# Patient Record
Sex: Female | Born: 1960
Health system: Southern US, Community
[De-identification: ages and names within clinical notes are randomized; demographics above are authoritative.]

## PROBLEM LIST (undated history)

## (undated) DIAGNOSIS — R7301 Impaired fasting glucose: Secondary | ICD-10-CM

## (undated) DIAGNOSIS — L03115 Cellulitis of right lower limb: Secondary | ICD-10-CM

## (undated) DIAGNOSIS — T7840XA Allergy, unspecified, initial encounter: Secondary | ICD-10-CM

## (undated) DIAGNOSIS — I10 Essential (primary) hypertension: Secondary | ICD-10-CM

## (undated) DIAGNOSIS — R740 Nonspecific elevation of levels of transaminase and lactic acid dehydrogenase [LDH]: Secondary | ICD-10-CM

## (undated) DIAGNOSIS — D649 Anemia, unspecified: Secondary | ICD-10-CM

## (undated) HISTORY — DX: Cellulitis of right lower limb: L03.115

## (undated) HISTORY — DX: Impaired fasting glucose: R73.01

## (undated) HISTORY — DX: Allergy, unspecified, initial encounter: T78.40XA

## (undated) HISTORY — DX: Nonspecific elevation of levels of transaminase and lactic acid dehydrogenase (ldh): R74.0

## (undated) HISTORY — PX: DILATION AND CURETTAGE OF UTERUS: SHX78

## (undated) HISTORY — PX: OTHER SURGICAL HISTORY: SHX169

## (undated) HISTORY — PX: COLONOSCOPY: SHX174

## (undated) HISTORY — DX: Essential (primary) hypertension: I10

## (undated) HISTORY — PX: TUBAL LIGATION: SHX77

## (undated) HISTORY — DX: Anemia, unspecified: D64.9

---

## 1998-07-27 ENCOUNTER — Other Ambulatory Visit: Admission: RE | Admit: 1998-07-27 | Discharge: 1998-07-27 | Payer: Self-pay | Admitting: Obstetrics and Gynecology

## 1999-07-09 ENCOUNTER — Ambulatory Visit (HOSPITAL_COMMUNITY): Admission: RE | Admit: 1999-07-09 | Discharge: 1999-07-09 | Payer: Self-pay | Admitting: *Deleted

## 1999-07-10 ENCOUNTER — Encounter: Payer: Self-pay | Admitting: Family Medicine

## 1999-07-10 ENCOUNTER — Inpatient Hospital Stay (HOSPITAL_COMMUNITY): Admission: AD | Admit: 1999-07-10 | Discharge: 1999-07-15 | Payer: Self-pay | Admitting: Family Medicine

## 2002-09-11 ENCOUNTER — Other Ambulatory Visit: Admission: RE | Admit: 2002-09-11 | Discharge: 2002-09-11 | Payer: Self-pay | Admitting: Obstetrics and Gynecology

## 2005-01-06 ENCOUNTER — Ambulatory Visit: Payer: Self-pay | Admitting: Internal Medicine

## 2006-02-06 ENCOUNTER — Ambulatory Visit: Payer: Self-pay | Admitting: Internal Medicine

## 2006-02-06 LAB — CONVERTED CEMR LAB
AST: 28 units/L (ref 0–37)
Cholesterol: 125 mg/dL (ref 0–200)
Hgb A1c MFr Bld: 5.9 % (ref 4.6–6.0)
LDL Cholesterol: 70 mg/dL (ref 0–99)
Microalb Creat Ratio: 5.6 mg/g (ref 0.0–30.0)
Microalb, Ur: 0.7 mg/dL (ref 0.0–1.9)
TSH: 1.46 microintl units/mL (ref 0.35–5.50)

## 2006-02-13 ENCOUNTER — Ambulatory Visit: Payer: Self-pay | Admitting: Internal Medicine

## 2006-05-18 ENCOUNTER — Encounter: Admission: RE | Admit: 2006-05-18 | Discharge: 2006-05-18 | Payer: Self-pay | Admitting: Obstetrics and Gynecology

## 2006-07-21 ENCOUNTER — Other Ambulatory Visit: Admission: RE | Admit: 2006-07-21 | Discharge: 2006-07-21 | Payer: Self-pay | Admitting: Diagnostic Radiology

## 2006-07-21 ENCOUNTER — Encounter: Admission: RE | Admit: 2006-07-21 | Discharge: 2006-07-21 | Payer: Self-pay | Admitting: *Deleted

## 2006-07-21 ENCOUNTER — Encounter (INDEPENDENT_AMBULATORY_CARE_PROVIDER_SITE_OTHER): Payer: Self-pay | Admitting: Diagnostic Radiology

## 2006-08-02 ENCOUNTER — Encounter: Payer: Self-pay | Admitting: Internal Medicine

## 2006-08-29 ENCOUNTER — Encounter: Admission: RE | Admit: 2006-08-29 | Discharge: 2006-08-29 | Payer: Self-pay | Admitting: *Deleted

## 2007-02-23 ENCOUNTER — Ambulatory Visit: Payer: Self-pay | Admitting: Internal Medicine

## 2007-07-03 ENCOUNTER — Ambulatory Visit: Payer: Self-pay | Admitting: Internal Medicine

## 2007-07-03 ENCOUNTER — Telehealth (INDEPENDENT_AMBULATORY_CARE_PROVIDER_SITE_OTHER): Payer: Self-pay | Admitting: *Deleted

## 2007-07-03 DIAGNOSIS — Z9889 Other specified postprocedural states: Secondary | ICD-10-CM | POA: Insufficient documentation

## 2007-07-03 DIAGNOSIS — D126 Benign neoplasm of colon, unspecified: Secondary | ICD-10-CM | POA: Insufficient documentation

## 2007-07-03 DIAGNOSIS — I152 Hypertension secondary to endocrine disorders: Secondary | ICD-10-CM | POA: Insufficient documentation

## 2007-07-03 DIAGNOSIS — D649 Anemia, unspecified: Secondary | ICD-10-CM | POA: Insufficient documentation

## 2007-07-03 DIAGNOSIS — I10 Essential (primary) hypertension: Secondary | ICD-10-CM | POA: Insufficient documentation

## 2007-07-03 DIAGNOSIS — H18609 Keratoconus, unspecified, unspecified eye: Secondary | ICD-10-CM | POA: Insufficient documentation

## 2007-07-03 LAB — CONVERTED CEMR LAB
Ketones, urine, test strip: NEGATIVE
Nitrite: NEGATIVE

## 2007-07-05 ENCOUNTER — Encounter (INDEPENDENT_AMBULATORY_CARE_PROVIDER_SITE_OTHER): Payer: Self-pay | Admitting: *Deleted

## 2007-07-05 LAB — CONVERTED CEMR LAB
ALT: 26 units/L (ref 0–35)
AST: 23 units/L (ref 0–37)
Basophils Absolute: 0 10*3/uL (ref 0.0–0.1)
Bilirubin, Direct: 0.1 mg/dL (ref 0.0–0.3)
CO2: 28 meq/L (ref 19–32)
Chloride: 99 meq/L (ref 96–112)
Cholesterol: 135 mg/dL (ref 0–200)
LDL Cholesterol: 83 mg/dL (ref 0–99)
Lymphocytes Relative: 28 % (ref 12.0–46.0)
MCHC: 34.4 g/dL (ref 30.0–36.0)
Neutrophils Relative %: 60.4 % (ref 43.0–77.0)
RBC: 4.16 M/uL (ref 3.87–5.11)
RDW: 14.5 % (ref 11.5–14.6)
Sodium: 138 meq/L (ref 135–145)
Total Bilirubin: 0.8 mg/dL (ref 0.3–1.2)
Triglycerides: 73 mg/dL (ref 0–149)
VLDL: 15 mg/dL (ref 0–40)

## 2007-07-11 ENCOUNTER — Encounter: Admission: RE | Admit: 2007-07-11 | Discharge: 2007-07-11 | Payer: Self-pay | Admitting: Internal Medicine

## 2007-07-12 ENCOUNTER — Encounter: Payer: Self-pay | Admitting: Internal Medicine

## 2007-07-18 ENCOUNTER — Encounter: Admission: RE | Admit: 2007-07-18 | Discharge: 2007-07-18 | Payer: Self-pay | Admitting: Internal Medicine

## 2008-03-24 ENCOUNTER — Ambulatory Visit: Payer: Self-pay | Admitting: Internal Medicine

## 2008-04-02 ENCOUNTER — Telehealth (INDEPENDENT_AMBULATORY_CARE_PROVIDER_SITE_OTHER): Payer: Self-pay | Admitting: *Deleted

## 2008-05-20 ENCOUNTER — Telehealth (INDEPENDENT_AMBULATORY_CARE_PROVIDER_SITE_OTHER): Payer: Self-pay | Admitting: *Deleted

## 2008-07-04 ENCOUNTER — Emergency Department (HOSPITAL_COMMUNITY): Admission: EM | Admit: 2008-07-04 | Discharge: 2008-07-04 | Payer: Self-pay | Admitting: Family Medicine

## 2008-07-07 ENCOUNTER — Telehealth (INDEPENDENT_AMBULATORY_CARE_PROVIDER_SITE_OTHER): Payer: Self-pay | Admitting: *Deleted

## 2008-07-08 ENCOUNTER — Encounter: Payer: Self-pay | Admitting: Internal Medicine

## 2009-01-31 DIAGNOSIS — R7401 Elevation of levels of liver transaminase levels: Secondary | ICD-10-CM

## 2009-01-31 DIAGNOSIS — R7301 Impaired fasting glucose: Secondary | ICD-10-CM

## 2009-01-31 HISTORY — DX: Impaired fasting glucose: R73.01

## 2009-01-31 HISTORY — DX: Elevation of levels of liver transaminase levels: R74.01

## 2009-03-30 ENCOUNTER — Telehealth (INDEPENDENT_AMBULATORY_CARE_PROVIDER_SITE_OTHER): Payer: Self-pay | Admitting: *Deleted

## 2009-04-15 ENCOUNTER — Telehealth (INDEPENDENT_AMBULATORY_CARE_PROVIDER_SITE_OTHER): Payer: Self-pay | Admitting: *Deleted

## 2009-04-26 ENCOUNTER — Emergency Department (HOSPITAL_COMMUNITY): Admission: EM | Admit: 2009-04-26 | Discharge: 2009-04-26 | Payer: Self-pay | Admitting: Family Medicine

## 2009-07-08 ENCOUNTER — Ambulatory Visit: Payer: Self-pay | Admitting: Internal Medicine

## 2009-07-08 DIAGNOSIS — J3089 Other allergic rhinitis: Secondary | ICD-10-CM

## 2009-07-08 DIAGNOSIS — D485 Neoplasm of uncertain behavior of skin: Secondary | ICD-10-CM | POA: Insufficient documentation

## 2009-07-08 DIAGNOSIS — J302 Other seasonal allergic rhinitis: Secondary | ICD-10-CM | POA: Insufficient documentation

## 2009-07-13 LAB — CONVERTED CEMR LAB
ALT: 43 units/L — ABNORMAL HIGH (ref 0–35)
AST: 31 units/L (ref 0–37)
Alkaline Phosphatase: 60 units/L (ref 39–117)
BUN: 16 mg/dL (ref 6–23)
Bilirubin, Direct: 0.1 mg/dL (ref 0.0–0.3)
Calcium: 10.5 mg/dL (ref 8.4–10.5)
Cholesterol: 140 mg/dL (ref 0–200)
Eosinophils Relative: 1.9 % (ref 0.0–5.0)
GFR calc non Af Amer: 117.25 mL/min (ref >60)
HCT: 39.6 % (ref 36.0–46.0)
LDL Cholesterol: 90 mg/dL (ref 0–99)
Lymphocytes Relative: 24.5 % (ref 12.0–46.0)
Lymphs Abs: 1.8 10*3/uL (ref 0.7–4.0)
Monocytes Relative: 6.3 % (ref 3.0–12.0)
Platelets: 297 10*3/uL (ref 150.0–400.0)
Potassium: 4.1 meq/L (ref 3.5–5.1)
Sodium: 140 meq/L (ref 135–145)
TSH: 1.4 microintl units/mL (ref 0.35–5.50)
Total Bilirubin: 0.7 mg/dL (ref 0.3–1.2)
VLDL: 10.2 mg/dL (ref 0.0–40.0)
WBC: 7.4 10*3/uL (ref 4.5–10.5)

## 2009-07-14 ENCOUNTER — Encounter (INDEPENDENT_AMBULATORY_CARE_PROVIDER_SITE_OTHER): Payer: Self-pay | Admitting: *Deleted

## 2009-07-29 ENCOUNTER — Telehealth (INDEPENDENT_AMBULATORY_CARE_PROVIDER_SITE_OTHER): Payer: Self-pay | Admitting: *Deleted

## 2009-08-07 ENCOUNTER — Encounter (INDEPENDENT_AMBULATORY_CARE_PROVIDER_SITE_OTHER): Payer: Self-pay | Admitting: *Deleted

## 2009-08-11 ENCOUNTER — Ambulatory Visit: Payer: Self-pay | Admitting: Gastroenterology

## 2009-08-27 ENCOUNTER — Ambulatory Visit: Payer: Self-pay | Admitting: Gastroenterology

## 2009-08-31 ENCOUNTER — Encounter: Payer: Self-pay | Admitting: Gastroenterology

## 2009-10-22 ENCOUNTER — Telehealth (INDEPENDENT_AMBULATORY_CARE_PROVIDER_SITE_OTHER): Payer: Self-pay | Admitting: *Deleted

## 2009-10-27 ENCOUNTER — Ambulatory Visit: Payer: Self-pay | Admitting: Internal Medicine

## 2009-10-27 DIAGNOSIS — J019 Acute sinusitis, unspecified: Secondary | ICD-10-CM | POA: Insufficient documentation

## 2010-02-15 ENCOUNTER — Telehealth (INDEPENDENT_AMBULATORY_CARE_PROVIDER_SITE_OTHER): Payer: Self-pay | Admitting: *Deleted

## 2010-03-04 NOTE — Progress Notes (Signed)
Summary: rx for mail order   Phone Note Refill Request Call back at Work Phone [phone]   Refills Requested: Medication #1:  AMLODIPINE BESYLATE 5 MG TABS 1 once daily  Medication #2:  LOSARTAN POTASSIUM-HCTZ 100-12.5 MG TABS patient took rx to [facility] but they only fill it for 30 day supply - they told her it wasnt on the $4.00 list - she wants rx written for 90 day supply & she will send to mail order    Initial call taken by: Okey Regal Spring,  July 29, 2009 10:33 AM  Follow-up for Phone Call        Patient aware rx at the front for pick-up Follow-up by: Shonna Chock,  July 29, 2009 11:46 AM    New/Updated Medications: LOSARTAN POTASSIUM-HCTZ 100-12.5 MG TABS (LOSARTAN POTASSIUM-HCTZ) 1 by mouth once daily Prescriptions: LOSARTAN POTASSIUM-HCTZ 100-12.5 MG TABS (LOSARTAN POTASSIUM-HCTZ) 1 by mouth once daily  #90 x 3   Entered by:   Shonna Chock   Authorized by:   Neena Rhymes MD   Signed by:   Shonna Chock on 07/29/2009   Method used:   Print then Give to Patient   RxID:   [id number] AMLODIPINE BESYLATE 5 MG TABS (AMLODIPINE BESYLATE) 1 once daily  #90 x 3   Entered by:   Shonna Chock   Authorized by:   Neena Rhymes MD   Signed by:   Shonna Chock on 07/29/2009   Method used:   Print then Give to Patient   RxID:   [id number]

## 2010-03-04 NOTE — Progress Notes (Signed)
Summary: PHONE-REFILL  Phone Note Call from Patient Call back at [facility] Phone [phone]   Caller: Patient Summary of Call: PATIENT IS REQUESTING THE ONE THAT IS COMBINE MICARDIS AND HCT COMBINE. [facility] [address]. PATIENT STATES SHE OUT OF MEDS. Initial call taken by: Barb Merino,  March 30, 2009 2:52 PM  Follow-up for Phone Call        Losartan /HCT 100/12.5 should be less expensive. #91,YN8. She is due for OV F/U Follow-up by: Marga Melnick MD,  March 31, 2009 1:22 PM  Additional Follow-up for Phone Call Additional follow up Details #1::        left detailed msg on patient voicemail also to scheduled office visit to make sure med is working.....Marland KitchenMarland KitchenDoristine Devoid  March 31, 2009 1:38 PM     New/Updated Medications: LOSARTAN POTASSIUM-HCTZ 100-12.5 MG TABS (LOSARTAN POTASSIUM-HCTZ) take one tablet daily Prescriptions: LOSARTAN POTASSIUM-HCTZ 100-12.5 MG TABS (LOSARTAN POTASSIUM-HCTZ) take one tablet daily  #30 x 2   Entered by:   Doristine Devoid   Authorized by:   Marga Melnick MD   Signed by:   Doristine Devoid on 03/31/2009   Method used:   [facility]        [facility] [address] [address] [phone]* (retail)       [address]       [place], [place]  [address]       Ph: [phone] or [phone]       Fax: [phone]   RxID:   [id number]

## 2010-03-04 NOTE — Assessment & Plan Note (Signed)
Summary: SINUS INFECTION/CDJ   Vital Signs:  Patient profile:   50 year old female Height:      66.25 inches Weight:      248 pounds O2 Sat:      99 % on Room air Temp:     98.3 degrees F oral Pulse rate:   98 / minute Resp:     16 per minute BP sitting:   140 / 82  (left arm)  Vitals Entered By: Jeremy Johann CMA (October 27, 2009 3:13 PM)  O2 Flow:  Room air CC: cough yellowish mucous, chest congestion, horseness x1week, URI symptoms   CC:  cough yellowish mucous, chest congestion, horseness x1week, and URI symptoms.  History of Present Illness: RTI  Symptoms      This is a 50 year old woman who presents with RTI  symptoms X 1 week.  The patient reports nasal congestion, purulent nasal discharge, and dry cough ( but" rattly"), but denies sore throat and earache.  The patient denies fever.  The patient also reports headache.  Risk factors for Strep sinusitis include bilateral facial pain.  The patient denies the following risk factors for Strep sinusitis: tooth pain and tender adenopathy.  Rx: Loratidine , Netipot , Mucinex   Current Medications (verified): 1)  Amlodipine Besylate 5 Mg Tabs (Amlodipine Besylate) .Marland Kitchen.. 1 Once Daily 2)  Losartan Potassium-Hctz 100-12.5 Mg Tabs (Losartan Potassium-Hctz) .Marland Kitchen.. 1 By Mouth Once Daily 3)  Loratadine 10 Mg Tabs (Loratadine) .Marland Kitchen.. 1 Once Daily As Needed Fro Allergies  Allergies (verified): No Known Drug Allergies  Physical Exam  General:  well-nourished,in no acute distress; alert,appropriate and cooperative throughout examination Ears:  External ear exam shows no significant lesions or deformities.  Otoscopic examination reveals clear canals, tympanic membranes are intact bilaterally without bulging, retraction, inflammation or discharge. Hearing is grossly normal bilaterally. Nose:  External nasal examination shows no deformity or inflammation. Nasal mucosa are pink and moist without lesions or exudates. Mouth:  Oral mucosa and  oropharynx without lesions or exudates.  Teeth in good repair. No pharyngeal erythema.   Lungs:  Normal respiratory effort, chest expands symmetrically. Lungs are clear to auscultation, no crackles or wheezes but cough. Heart:  normal rate, regular rhythm, no gallop, no rub, no JVD, and grade 1 /6 systolic murmur.   Cervical Nodes:  No lymphadenopathy noted Axillary Nodes:  No palpable lymphadenopathy   Impression & Recommendations:  Problem # 1:  BRONCHITIS-ACUTE (ICD-466.0)  Her updated medication list for this problem includes:    Amoxicillin 500 Mg Caps (Amoxicillin) .Marland Kitchen... 1 three times a day    Hydromet 5-1.5 Mg/76ml Syrp (Hydrocodone-homatropine) .Marland Kitchen... 1 tsp every 6 hrs as needed  Problem # 2:  SINUSITIS- ACUTE-NOS (ICD-461.9)  Her updated medication list for this problem includes:    Amoxicillin 500 Mg Caps (Amoxicillin) .Marland Kitchen... 1 three times a day    Hydromet 5-1.5 Mg/62ml Syrp (Hydrocodone-homatropine) .Marland Kitchen... 1 tsp every 6 hrs as needed    Fluticasone Propionate 50 Mcg/act Susp (Fluticasone propionate) .Marland Kitchen... 1 spray two times a day  Complete Medication List: 1)  Amlodipine Besylate 5 Mg Tabs (Amlodipine besylate) .Marland Kitchen.. 1 once daily 2)  Losartan Potassium-hctz 100-12.5 Mg Tabs (Losartan potassium-hctz) .Marland Kitchen.. 1 by mouth once daily 3)  Loratadine 10 Mg Tabs (Loratadine) .Marland Kitchen.. 1 once daily as needed fro allergies 4)  Amoxicillin 500 Mg Caps (Amoxicillin) .Marland Kitchen.. 1 three times a day 5)  Hydromet 5-1.5 Mg/61ml Syrp (Hydrocodone-homatropine) .Marland Kitchen.. 1 tsp every 6 hrs as needed 6)  Fluticasone Propionate 50 Mcg/act Susp (Fluticasone propionate) .Marland Kitchen.. 1 spray two times a day  Patient Instructions: 1)  Drink as much NON dairy  fluid as you can tolerate for the next few days. Continue Neti pot once daily until sinuses are clear. Prescriptions: FLUTICASONE PROPIONATE 50 MCG/ACT SUSP (FLUTICASONE PROPIONATE) 1 spray two times a day  #1 x 5   Entered and Authorized by:   Marga Melnick MD   Signed by:    Marga Melnick MD on 10/27/2009   Method used:   [facility]ed to ...       [facility] [address] [phone]* (retail)       [address]       [place], [place]  [address]       Ph: [phone] or [phone]       Fax: [phone]   RxID:   [id number] HYDROMET 5-1.5 MG/5ML SYRP (HYDROCODONE-HOMATROPINE) 1 tsp every 6 hrs as needed  #120cc x 0   Entered and Authorized by:   Marga Melnick MD   Signed by:   Marga Melnick MD on 10/27/2009   Method used:   Printed then faxed to ...       [facility] [address] [phone]* (retail)       [address]       [place], [place]  [address]       Ph: [phone] or [phone]       Fax: [phone]   RxID:   [id number] AMOXICILLIN 500 MG CAPS (AMOXICILLIN) 1 three times a day  #30 x 0   Entered and Authorized by:   Marga Melnick MD   Signed by:   Marga Melnick MD on 10/27/2009   Method used:   Faxed to ...       [facility] [address] [phone]* (retail)       [address]       [place], [place]  [address]       Ph: [phone] or [phone]       Fax: [phone]   RxID:   [id number]

## 2010-03-04 NOTE — Letter (Signed)
Summary: Patient Notice-Hyperplastic Polyps  [facility]  [address] [place], [place] [address]   Phone: [phone]  Fax: [phone]        August 31, 2009 MRN: [id number]    Rebecca Foley [address] [place], [place]  [address]    Dear [name],  I am pleased to inform you that the colon polyp(s) removed during your recent colonoscopy was (were) found to be hyperplastic.  These types of polyps are NOT pre-cancerous.  It is therefore my recommendation that you have a repeat colonoscopy examination in 10_ years for routine colorectal cancer screening.  Should you develop new or worsening symptoms of abdominal pain, bowel habit changes or bleeding from the rectum or bowels, please schedule an evaluation with either your primary care physician or with me.  Additional information/recommendations:  __No further action with gastroenterology is needed at this time.      Please follow-up with your primary care physician for your other      healthcare needs. __Please call [phone] to schedule a return visit to review      your situation.  __Please keep your follow-up visit as already scheduled.  _x_Continue treatment plan as outlined the day of your exam.  Please call us if you are having persistent problems or have questions about your condition that have not been fully answered at this time.  Sincerely,  Louis Meckel MD This letter has been electronically signed by your physician.  Appended Document: Patient Notice-Hyperplastic Polyps Letter mailed 8.3.2011

## 2010-03-04 NOTE — Progress Notes (Signed)
Summary: med for laryngiti  Phone Note Call from Patient   Caller: Patient Summary of Call: patient msg on voicemail wanted to get medication for laryngitis  Follow-up for Phone Call        left message on machine ........Marland KitchenDoristine Devoid CMA  October 22, 2009 1:38 PM   patient return tried calling back left message on machine detailed information that atb not treat for laryngitis informed to drink plenty of fluids and use ibruprofen to help decrease inflammation.......Marland KitchenDoristine Devoid CMA  October 22, 2009 2:57 PM

## 2010-03-04 NOTE — Miscellaneous (Signed)
Summary: [facility] Previsit/prep  Clinical Lists Changes  Medications: Added new medication of MOVIPREP 100 GM  SOLR (PEG-KCL-NACL-NASULF-NA ASC-C) As per prep instructions. - Signed Rx of MOVIPREP 100 GM  SOLR (PEG-KCL-NACL-NASULF-NA ASC-C) As per prep instructions.;  #1 x 0;  Signed;  Entered by: Wyona Almas RN;  Authorized by: Louis Meckel MD;  Method used: [facility] [address] [phone]*, [address] [place], [place], [place]  [address], Ph: [phone] or [phone], Fax: [phone] Observations: Added new observation of NKA: T (08/11/2009 8:06)    Prescriptions: MOVIPREP 100 GM  SOLR (PEG-KCL-NACL-NASULF-NA ASC-C) As per prep instructions.  #1 x 0   Entered by:   Wyona Almas RN   Authorized by:   Louis Meckel MD   Signed by:   Wyona Almas RN on 08/11/2009   Method used:   Electronically to        [facility] [address] [phone]* (retail)       [address]       [place], [place]  [address]       Ph: [phone] or [phone]       Fax: [phone]   RxID:   [id number]

## 2010-03-04 NOTE — Progress Notes (Signed)
Summary: Amlodipine refill  Phone Note Refill Request Message from:  Fax from Pharmacy on February 15, 2010 9:13 AM  Refills Requested: Medication #1:  AMLODIPINE BESYLATE 5 MG TABS 1 once daily [place], phone - [phone],  fax   [phone]    qrty = 90   (Ref# [id number] if calling)    ***NOTE***  fax has Dr Alwyn Ren listed as [facility] Doctor  Next Appointment Scheduled: none Initial call taken by: Jerolyn Shin,  February 15, 2010 9:16 AM  Follow-up for Phone Call        RX faxed to: [phone]   Follow-up by: Shonna Chock CMA,  February 15, 2010 1:40 PM    Prescriptions: AMLODIPINE BESYLATE 5 MG TABS (AMLODIPINE BESYLATE) 1 once daily  #90 x 2   Entered by:   Shonna Chock CMA   Authorized by:   Marga Melnick MD   Signed by:   Shonna Chock CMA on 02/15/2010   Method used:   Print then Give to Patient   RxID:   [id number]

## 2010-03-04 NOTE — Procedures (Signed)
Summary: Colonoscopy  Patient: [name] Note: All result statuses are Final unless otherwise noted.  Tests: (1) Colonoscopy (COL)   COL Colonoscopy           DONE     [facility]     [address]     [place], [place]  [address]           COLONOSCOPY PROCEDURE REPORT           PATIENT:  Rebecca Foley, Rebecca Foley  MR#:  [id number]     BIRTHDATE:  [birth year], 49 yrs. old  GENDER:  female     ENDOSCOPIST:  Barbette Hair. Arlyce Dice, MD     REF. BY:     PROCEDURE DATE:  08/27/2009     PROCEDURE:  Colonoscopy with snare polypectomy     ASA CLASS:  Class I     INDICATIONS:  Routine Risk Screening     MEDICATIONS:   Fentanyl 75 mcg IV, Versed 7 mg IV           DESCRIPTION OF PROCEDURE:   After the risks benefits and     alternatives of the procedure were thoroughly explained, informed     consent was obtained.  Digital rectal exam was performed and     revealed no abnormalities.   The [id number] endoscope was     introduced through the anus and advanced to the cecum, which was     identified by both the appendix and ileocecal valve, without     limitations.  The quality of the prep was excellent, using     MoviPrep.  The instrument was then slowly withdrawn as the colon     was fully examined.     <<PROCEDUREIMAGES>>           FINDINGS:  A sessile polyp was found in the sigmoid colon. It was     3 mm in size. It was found 18 cm from the point of entry. Polyp     was snared without cautery. Retrieval was successful (see     image13). snare polyp  This was otherwise a normal examination of     the colon (see image2, image3, image4, image5, image10, image14,     and image15).   Retroflexed views in the rectum revealed no     abnormalities.    The scope was then withdrawn from the patient     and the procedure completed.           COMPLICATIONS:  None     ENDOSCOPIC IMPRESSION:     1) 3 mm sessile polyp in the sigmoid colon     2) Otherwise normal examination     RECOMMENDATIONS:   1) If the polyp(s) removed today are proven to be adenomatous     (pre-cancerous) polyps, you will need a repeat colonoscopy in 5     years. Otherwise you should continue to follow colorectal cancer     screening guidelines for "routine risk" patients with colonoscopy     in 10 years.     REPEAT EXAM:   You will receive a letter from Dr. Arlyce Dice in 1-2     weeks, after reviewing the final pathology, with followup     recommendations.           ______________________________     Barbette Hair Arlyce Dice, MD           CC:  Pecola Lawless, MD  n.     eSIGNED:   Barbette Hair. Cher Franzoni at 08/27/2009 11:26 AM           Chauncey Mann, [id number]  Note: An exclamation mark (!) indicates a result that was not dispersed into the flowsheet. Document Creation Date: 08/27/2009 11:27 AM _______________________________________________________________________  (1) Order result status: Final Collection or observation date-time: 08/27/2009 11:22 Requested date-time:  Receipt date-time:  Reported date-time:  Referring Physician:   Ordering Physician: Melvia Heaps [id number]) Specimen Source:  Source: Launa Grill Order Number: [id number] Lab site:   Appended Document: Colonoscopy     Procedures Next Due Date:    Colonoscopy: 08/2019

## 2010-03-04 NOTE — Letter (Signed)
Summary: [facility] Instructions  [facility]  [address] [place], [place] [address]   Phone: [phone]  Fax: [phone]       Rebecca Foley    [birth year]    MRN: [id number]        Procedure Day /Date: Thursday 08-27-09     Arrival Time: 9:30 a.m.     Procedure Time: 10:30 a.m.     Location of Procedure:                    _x_   [facility] Center (4th Floor)   PREPARATION FOR COLONOSCOPY WITH MOVIPREP   Starting 5 days prior to your procedure  08-22-09 do not eat nuts, seeds, popcorn, corn, beans, peas,  salads, or any raw vegetables.  Do not take any fiber supplements (e.g. Metamucil, Citrucel, and Benefiber).  THE DAY BEFORE YOUR PROCEDURE         DATE:  08-26-09   DAY:  Wednesday  1.  Drink clear liquids the entire day-NO SOLID FOOD  2.  Do not drink anything colored red or purple.  Avoid juices with pulp.  No orange juice.  3.  Drink at least 64 oz. (8 glasses) of fluid/clear liquids during the day to prevent dehydration and help the prep work efficiently.  CLEAR LIQUIDS INCLUDE: Water Jello Ice Popsicles Tea (sugar ok, no milk/cream) Powdered fruit flavored drinks Coffee (sugar ok, no milk/cream) Gatorade Juice: apple, white grape, white cranberry  Lemonade Clear bullion, consomm, broth Carbonated beverages (any kind) Strained chicken noodle soup Hard Candy                             4.  In the morning, mix first dose of MoviPrep solution:    Empty 1 Pouch A and 1 Pouch B into the disposable container    Add lukewarm drinking water to the top line of the container. Mix to dissolve    Refrigerate (mixed solution should be used within 24 hrs)  5.  Begin drinking the prep at 5:00 p.m. The MoviPrep container is divided by 4 marks.   Every 15 minutes drink the solution down to the next mark (approximately 8 oz) until the full liter is complete.   6.  Follow completed prep with 16 oz of clear liquid of your choice (Nothing red or  purple).  Continue to drink clear liquids until bedtime.  7.  Before going to bed, mix second dose of MoviPrep solution:    Empty 1 Pouch A and 1 Pouch B into the disposable container    Add lukewarm drinking water to the top line of the container. Mix to dissolve    Refrigerate  THE DAY OF YOUR PROCEDURE      DATE:  08-27-09  DAY: Thursday  Beginning at  5:30 a.m. (5 hours before procedure):         1. Every 15 minutes, drink the solution down to the next mark (approx 8 oz) until the full liter is complete.  2. Follow completed prep with 16 oz. of clear liquid of your choice.    3. You may drink clear liquids until  8:30 a.m.  (2 HOURS BEFORE PROCEDURE).   MEDICATION INSTRUCTIONS  Unless otherwise instructed, you should take regular prescription medications with a small sip of water   as early as possible the morning of your procedure.   Additional medication instructions: Hold Losartan/HCTZ the morning of procedure.  OTHER INSTRUCTIONS  You will need a responsible adult at least 52 years of age to accompany you and drive you home.   This person must remain in the waiting room during your procedure.  Wear loose fitting clothing that is easily removed.  Leave jewelry and other valuables at home.  However, you may wish to bring a book to read or  an iPod/MP3 player to listen to music as you wait for your procedure to start.  Remove all body piercing jewelry and leave at home.  Total time from sign-in until discharge is approximately 2-3 hours.  You should go home directly after your procedure and rest.  You can resume normal activities the  day after your procedure.  The day of your procedure you should not:   Drive   Make legal decisions   Operate machinery   Drink alcohol   Return to work  You will receive specific instructions about eating, activities and medications before you leave.    The above instructions have been reviewed and explained  to me by   Wyona Almas RN  August 11, 2009 8:49 AM     I fully understand and can verbalize these instructions _____________________________ Date _________

## 2010-03-04 NOTE — Assessment & Plan Note (Signed)
Summary: CPX/KDC   Vital Signs:  Patient profile:   50 year old female Height:      66.25 inches Weight:      247.2 pounds BMI:     39.74 Temp:     98.2 degrees F oral Pulse rate:   84 / minute Resp:     16 per minute BP sitting:   130 / 86  (left arm) Cuff size:   large  Vitals Entered By: Shonna Chock (July 08, 2009 8:37 AM)  CC: CPX with fasting labs , RX for Fexofenadine, General Medical Evaluation, Hypertension Management Comments REVIEWED MED LIST, PATIENT AGREED DOSE AND INSTRUCTION CORRECT    CC:  CPX with fasting labs , RX for Fexofenadine, General Medical Evaluation, and Hypertension Management.  History of Present Illness: Rebecca Foley is here for a physical; she is asymptomatic except for allergic symptoms.  Hypertension History:      She complains of dyspnea with exertion, but denies headache, chest pain, palpitations, orthopnea, PND, peripheral edema, visual symptoms, neurologic problems, syncope, and side effects from treatment.  BP  @ home 130/70-75. DOE only with stairs.        Positive major cardiovascular risk factors include hypertension.  Negative major cardiovascular risk factors include female age less than 48 years old and non-tobacco-user status.        Positive history for target organ damage include peripheral vascular disease.     Preventive Screening-Counseling & Management  Alcohol-Tobacco     Smoking Status: never  Caffeine-Diet-Exercise     Does Patient Exercise: no  Allergies (verified): No Known Drug Allergies  Past History:  Past Medical History: Anemia, PMH of  1998 colon polyp, adenomatous; negative 2001, Dr Arlyce Dice Allergic rhinitis  Past Surgical History: G 2 P 2; C section X 2; Dilation and curettage X 1 for Metromenorrhagia Colon polypectomy Hand surgery for Trigger Thumb, Dr Madelon Lips  Family History: Father: HTN,CVA @ 28 Mother: HTN, alcoholism Siblings: negative   Social History: No diet, restricts salt Occupation:  Pensions consultant Married but separated Never Smoked Alcohol use-no Regular exercise-no  Review of Systems General:  Denies chills, fatigue, fever, sweats, and weight loss. Eyes:  Denies blurring, double vision, and vision loss-both eyes. ENT:  Complains of nasal congestion and sinus pressure; denies difficulty swallowing and hoarseness; No purulence. CV:  Denies leg cramps with exertion. Resp:  Denies cough, shortness of breath, sputum productive, and wheezing. GI:  Denies abdominal pain, bloody stools, dark tarry stools, and indigestion; Due for colonoscopy. GU:  Denies discharge, dysuria, and hematuria; No Gyn  F/U X 3 years. MS:  Denies joint pain, joint redness, joint swelling, low back pain, mid back pain, and thoracic pain. Derm:  Complains of lesion(s); denies changes in nail beds, dryness, and hair loss; Lesion OD lid   enlarging ; 3 OS area stable. Neuro:  Denies numbness and tingling. Psych:  Denies anxiety and depression. Endo:  Denies cold intolerance, excessive hunger, excessive thirst, excessive urination, and heat intolerance. Heme:  Denies abnormal bruising and bleeding. Allergy:  Complains of itching eyes, seasonal allergies, and sneezing; denies hives or rash; No angioedema.  Physical Exam  General:  well-nourished; alert,appropriate and cooperative throughout examination Head:  Normocephalic and atraumatic without obvious abnormalities. Eyes:  No corneal or conjunctival inflammation noted. Perrla. Funduscopic exam benign, without hemorrhages, exudates or papilledema. Ears:  External ear exam shows no significant lesions or deformities.  Otoscopic examination reveals clear canals, tympanic membranes are intact bilaterally without bulging, retraction, inflammation  or discharge. Hearing is grossly normal bilaterally. Nose:  External nasal examination shows no deformity or inflammation. Nasal mucosa are pink and moist without lesions or exudates. Mouth:  Oral mucosa and  oropharynx without lesions or exudates.  Teeth in good repair. Neck:  No deformities, masses, or tenderness noted. Lungs:  Normal respiratory effort, chest expands symmetrically. Lungs are clear to auscultation, no crackles or wheezes. Heart:  regular rhythm, no gallop, no rub, no JVD, no HJR, tachycardia, and grade  1/6 systolic murmur.   Abdomen:  Bowel sounds positive,abdomen soft and non-tender without masses, organomegaly or hernias noted. Suprapubic op scar. No AAA Genitalia:  as per Gyn Msk:  No deformity or scoliosis noted of thoracic or lumbar spine.   Pulses:  R and L carotid,radial,dorsalis pedis and posterior tibial pulses are full and equal bilaterally Extremities:  No clubbing, cyanosis, edema, or deformity noted with normal full range of motion of all joints.   Neurologic:  alert & oriented X3 and DTRs symmetrical and normal.   Skin:  3 polypoid lesions OS ; 1 polypoid lesion OD lower lid Cervical Nodes:  No lymphadenopathy noted Axillary Nodes:  No palpable lymphadenopathy Psych:  memory intact for recent and remote, normally interactive, good eye contact, not anxious appearing, and not depressed appearing.     Impression & Recommendations:  Problem # 1:  ROUTINE GENERAL MEDICAL EXAM@HEALTH  CARE FACL (ICD-V70.0)  Orders: EKG w/ Interpretation (93000) Venipuncture ([address]) TLB-Lipid Panel (80061-LIPID) TLB-BMP (Basic Metabolic Panel-BMET) (80048-METABOL) TLB-CBC Platelet - w/Differential (85025-CBCD) TLB-Hepatic/Liver Function Pnl (80076-HEPATIC) TLB-TSH (Thyroid Stimulating Hormone) (60454-UJW) Dermatology Referral (Derma) Gastroenterology Referral (GI)  Problem # 2:  NEOPLASM OF UNCERTAIN BEHAVIOR OF SKIN (ICD-238.2)  Lower lid lesions  bilaterally  Orders: Dermatology Referral (Derma)  Problem # 3:  HYPERTENSION (ICD-401.9) Controlled Her updated medication list for this problem includes:    Amlodipine Besylate 5 Mg Tabs (Amlodipine besylate) .Marland Kitchen... 1 once  daily    Losartan Potassium-hctz 100-12.5 Mg Tabs (Losartan potassium-hctz)  Orders: EKG w/ Interpretation (93000) Venipuncture (11914)  Problem # 4:  ALLERGIC RHINITIS (ICD-477.9)  The following medications were removed from the medication list:    Fexofenadine Hcl 180 Mg Tabs (Fexofenadine hcl) .Marland Kitchen... 1 by mouth once daily Her updated medication list for this problem includes:    Loratadine 10 Mg Tabs (Loratadine) .Marland Kitchen... 1 once daily as needed fro allergies  Orders: Venipuncture (78295)  Problem # 5:  COLONIC POLYPS (ICD-211.3)  Overdue for F/U  Orders: EKG w/ Interpretation (93000) Venipuncture (62130)  Problem # 6:  ANEMIA-NOS (ICD-285.9) PMH of   Orders: Venipuncture (86578)  Complete Medication List: 1)  Amlodipine Besylate 5 Mg Tabs (Amlodipine besylate) .Marland Kitchen.. 1 once daily 2)  Losartan Potassium-hctz 100-12.5 Mg Tabs (Losartan potassium-hctz) 3)  Loratadine 10 Mg Tabs (Loratadine) .Marland Kitchen.. 1 once daily as needed fro allergies  Hypertension Assessment/Plan:      The patient's hypertensive risk group is category C: Target organ damage and/or diabetes.  Her calculated 10 year risk of coronary heart disease is 7 %.  Today's blood pressure is 130/86.    Patient Instructions: 1)  It is important that you exercise regularly at least 20 minutes 5 times a week. If you develop chest pain, have severe difficulty breathing, or feel very tired , stop exercising immediately and seek medical attention. 2)  Schedule your mammogram. 3)  Schedule a colonoscopy  to help detect colon cancer. 4)  You need to have a Pap Smear to prevent cervical cancer; please see your  Gynecoilogist. 5)  Check your Blood Pressure regularly. If it is above: 140/90 ON AVERAGE you should make an appointment. Prescriptions: LOSARTAN POTASSIUM-HCTZ 100-12.5 MG TABS (LOSARTAN POTASSIUM-HCTZ)   #3 x 0   Entered and Authorized by:   Marga Melnick MD   Signed by:   Marga Melnick MD on 07/08/2009   Method used:    Print then Give to Patient   RxID:   [id number] AMLODIPINE BESYLATE 5 MG TABS (AMLODIPINE BESYLATE) 1 once daily  #90 x 3   Entered and Authorized by:   Marga Melnick MD   Signed by:   Marga Melnick MD on 07/08/2009   Method used:   Print then Give to Patient   RxID:   [id number] LORATADINE 10 MG TABS (LORATADINE) 1 once daily as needed fro allergies  #90 x 3   Entered and Authorized by:   Marga Melnick MD   Signed by:   Marga Melnick MD on 07/08/2009   Method used:   Print then Give to Patient   RxID:   [id number]     Appended Document: CPX/KDC I spoke with [facility] on [address] to correct Losartan RX to # 30, 3 was written in error

## 2010-03-04 NOTE — Letter (Signed)
Summary: Previsit letter  [facility]  [address] [place], [place] [address]   Phone: [phone]  Fax: [phone]       07/14/2009 MRN: [id number]  Rebecca Foley [address] [place], [place]  [address]  Dear [name],  Welcome to the Gastroenterology Division at [facility].    You are scheduled to see a nurse for your pre-procedure visit on 08/11/2009 at 8:30AM on the 3rd floor at [facility], [address]  We ask that you try to arrive at our office 15 minutes prior to your appointment time to allow for check-in.  Your nurse visit will consist of discussing your medical and surgical history, your immediate family medical history, and your medications.    Please bring a complete list of all your medications or, if you prefer, bring the medication bottles and we will list them.  We will need to be aware of both prescribed and over the counter drugs.  We will need to know exact dosage information as well.  If you are on blood thinners (Coumadin, Plavix, Aggrenox, Ticlid, etc.) please call our office today/prior to your appointment, as we need to consult with your physician about holding your medication.   Please be prepared to read and sign documents such as consent forms, a financial agreement, and acknowledgement forms.  If necessary, and with your consent, a friend or relative is welcome to sit-in on the nurse visit with you.  Please bring your insurance card so that we may make a copy of it.  If your insurance requires a referral to see a specialist, please bring your referral form from your primary care physician.  No co-pay is required for this nurse visit.     If you cannot keep your appointment, please call [phone] to cancel or reschedule prior to your appointment date.  This allows [place] the opportunity to schedule an appointment for another patient in need of care.    Thank you for choosing [facility] for your medical  needs.  We appreciate the opportunity to care for you.  Please visit [place] at our website  to learn more about our practice.                     Sincerely.                                                                                                                   The Gastroenterology Division

## 2010-03-04 NOTE — Progress Notes (Signed)
Summary: Refill Request  Phone Note Refill Request Message from:  Fax from Pharmacy on April 15, 2009 9:35 AM  micardis [facility] [address] fax [phone]   Method Requested: Fax to Local Pharmacy Next Appointment Scheduled: 07/08/2009 Initial call taken by: Barb Merino,  April 15, 2009 9:36 AM  Follow-up for Phone Call        Left message on VM for patient to call to discuss B/P meds. We recieved request for Micardis but based on med list Micardis was changed to Losartan. Follow-up by: Shonna Chock,  April 15, 2009 10:09 AM  Additional Follow-up for Phone Call Additional follow up Details #1::        Patient called back and said she is taking Losartan and this was sent to [place] in error Additional Follow-up by: Shonna Chock,  April 15, 2009 10:14 AM

## 2010-05-10 LAB — WOUND CULTURE

## 2010-05-24 ENCOUNTER — Telehealth: Payer: Self-pay | Admitting: Internal Medicine

## 2010-05-24 MED ORDER — FEXOFENADINE HCL 180 MG PO TABS: 180.0000 mg | ORAL_TABLET | Freq: Every day | ORAL | Status: DC | PRN

## 2010-05-24 NOTE — Telephone Encounter (Signed)
Patient says her allergies are horrible---hasnt needed Fexofenadine 180mg  since last year  (refill expired last year)---please call prescription to [facility], [place]

## 2010-05-26 ENCOUNTER — Other Ambulatory Visit: Payer: Self-pay | Admitting: *Deleted

## 2010-05-26 MED ORDER — FEXOFENADINE HCL 180 MG PO TABS: 180.0000 mg | ORAL_TABLET | Freq: Every day | ORAL | Status: DC | PRN

## 2010-06-18 NOTE — Discharge Summary (Signed)
[facility]  Patient:    Rebecca Foley, Rebecca Foley                      MRN: [id number] Adm. Date:  [id number] Disc. Date: [id number] Attending:  Nelwyn Salisbury Dictator:   Cornell Barman, P.A. CC:         Titus Dubin. Alwyn Ren, M.D. [facility]             Stacie Glaze, M.D. [facility]                           Discharge Summary  DISCHARGE DIAGNOSES: 1. Right lower extremity cellulitis. 2. Anemia. 3. Leukocytosis.  HISTORY OF PRESENT ILLNESS:  Rebecca Foley is a 50 year old female who presented with right leg pain and swelling with fever x 3 days.  The patient was seen in the Urgent Care the day prior to admission.  Lower extremity ultrasound was negative for DVT.  PAST MEDICAL HISTORY: 1. Hypertension. 2. Anemia, has been on iron in the past.  Workup has included two normal    colonoscopies. 3. Status post C-section x 2.  HOSPITAL COURSE: #1 - RIGHT LOWER EXTREMITY SWELLING WITH FEVER AND LEUKOCYTOSIS CONSISTENT WITH CELLULITIS:  As noted, there was no evidence of DVT.  The patient was started on IV antibiotics.  The patients fever resolved, and her antibiotics were changed to oral antibiotics.  The patient presented with a leukocytosis of 23.7.  This showed minimal improvement despite four days of treatment.  At discharge, her white count had begun to improve, and at discharge her white count was 16,000.  The patient is symptomatically improved as well.  #2 - ANEMIA:  According to the patient, this is secondary to her menses, and her GI workup has been negative.  She has had a D&C.  She may need a hysterectomy and/or the use of birth control pills.  The patient is to follow up with gynecology.  The patient did receive two units of packed red blood cells and has remained hemodynamically stable since her transfusion.  #3 - HYPERTENSION:  The patient has remained stable on her blood pressure medication.  LABORATORY DATA:  White count 16, hemoglobin 9.5, hematocrit  31.4.  TSH was 1.587.  CT of her lower extremity showed no DVT.  DISCHARGE MEDICATIONS: 1. Levaquin 500 mg q.d. 2. Iron sulfate 325 mg b.i.d. 3. Atenolol 50 mg q.d.  FOLLOW-UP:  The patient is to follow up with Dr. Lovell Sheehan next week. DD:  07/15/99 TD:  07/19/99 Job: [id number]

## 2010-11-15 LAB — CULTURE, ROUTINE-ABSCESS: Culture: NO GROWTH

## 2010-11-17 LAB — CULTURE, ROUTINE-ABSCESS

## 2011-02-02 ENCOUNTER — Other Ambulatory Visit: Payer: Self-pay | Admitting: Internal Medicine

## 2011-03-30 ENCOUNTER — Ambulatory Visit (INDEPENDENT_AMBULATORY_CARE_PROVIDER_SITE_OTHER): Payer: Managed Care, Other (non HMO) | Admitting: Internal Medicine

## 2011-03-30 ENCOUNTER — Encounter: Payer: Self-pay | Admitting: Internal Medicine

## 2011-03-30 VITALS — BP 144/96 | HR 116 | Temp 99.0°F | Resp 14 | Ht 66.0 in | Wt 269.0 lb

## 2011-03-30 DIAGNOSIS — Z0000 Encounter for general adult medical examination without abnormal findings: Secondary | ICD-10-CM

## 2011-03-30 DIAGNOSIS — I10 Essential (primary) hypertension: Secondary | ICD-10-CM

## 2011-03-30 LAB — CBC WITH DIFFERENTIAL/PLATELET
Basophils Absolute: 0 10*3/uL (ref 0.0–0.1)
Eosinophils Absolute: 0.3 10*3/uL (ref 0.0–0.7)
Hemoglobin: 13.3 g/dL (ref 12.0–15.0)
Lymphocytes Relative: 25.1 % (ref 12.0–46.0)
Lymphs Abs: 1.8 10*3/uL (ref 0.7–4.0)
MCHC: 33.6 g/dL (ref 30.0–36.0)
Neutro Abs: 4.6 10*3/uL (ref 1.4–7.7)
RDW: 13.6 % (ref 11.5–14.6)

## 2011-03-30 LAB — POCT URINALYSIS DIPSTICK
Blood, UA: NEGATIVE
Clarity, UA: NEGATIVE
Ketones, UA: NEGATIVE
Protein, UA: NEGATIVE
Spec Grav, UA: 1.01
pH, UA: 7

## 2011-03-30 LAB — BASIC METABOLIC PANEL
BUN: 12 mg/dL (ref 6–23)
CO2: 25 mEq/L (ref 19–32)
Calcium: 10 mg/dL (ref 8.4–10.5)
Glucose, Bld: 101 mg/dL — ABNORMAL HIGH (ref 70–99)
Sodium: 137 mEq/L (ref 135–145)

## 2011-03-30 LAB — LIPID PANEL
HDL: 43 mg/dL (ref >39.00)
Triglycerides: 77 mg/dL (ref 0.0–149.0)

## 2011-03-30 LAB — HEPATIC FUNCTION PANEL
Albumin: 4.1 g/dL (ref 3.5–5.2)
Alkaline Phosphatase: 59 U/L (ref 39–117)

## 2011-03-30 LAB — HEMOGLOBIN A1C: Hgb A1c MFr Bld: 6.3 % (ref 4.6–6.5)

## 2011-03-30 MED ORDER — LOSARTAN POTASSIUM 100 MG PO TABS: 100.0000 mg | ORAL_TABLET | Freq: Every day | ORAL | Status: DC

## 2011-03-30 MED ORDER — FUROSEMIDE 40 MG PO TABS: 40.0000 mg | ORAL_TABLET | Freq: Every day | ORAL | Status: DC

## 2011-03-30 MED ORDER — VERAPAMIL HCL 120 MG PO TABS: ORAL_TABLET | ORAL | Status: DC

## 2011-03-30 NOTE — Patient Instructions (Signed)
Preventive Health Care: Exercise  30-45  minutes a day, 3-4 days a week. Walking is especially valuable in preventing Osteoporosis. Eat a low-fat diet with lots of fruits and vegetables, up to 7-9 servings per day. Avoid obesity; your goal = waist less than 35 inches.Consume less than 30 grams of sugar per day from foods & drinks with High Fructose Corn Syrup as # 1,2,3 or #4 on label. Health Care Power of Attorney & Living Will place you in charge of your health care  decisions. Verify these are  in place. Blood Pressure Goal  Ideally is an AVERAGE < 135/85. This AVERAGE should be calculated from @ least 5-7 BP readings taken @ different times of day on different days of week. You should not respond to isolated BP readings , but rather the AVERAGE for that week   

## 2011-03-30 NOTE — Assessment & Plan Note (Signed)
Blood pressure control appears to be suboptimal on the present regimen. She exhibits a mild tachycardia with a systolic murmur. There are no hypertensive or ischemic changes on EKG. See changes in meds Note: She denies taking decongestants or excess stimulants.

## 2011-03-30 NOTE — Progress Notes (Signed)
  Subjective:    Patient ID: Rebecca Foley, female    DOB: 05/09/1960, 51 y.o.   MRN: [id number]  HPI  [name] is here for a physical; she denies acute issues.     Review of Systems  HYPERTENSION: Disease Monitoring  Blood pressure range: 137-140/85-90s  Chest pain: no  Dyspnea: no   Claudication: no Medication compliance: yes Medication Side Effects  Lightheadedness: no  Urinary frequency: no  Edema: occasionally in ankles with flat shoes  Preventitive Healthcare:  Exercise: no   Diet Pattern:no  Salt Restriction:usually, no added salt       Objective:   Physical Exam Gen.:  well-nourished in appearance; weight excess. Alert, appropriate and cooperative throughout exam. Head: Normocephalic without obvious abnormalities  Eyes: No corneal or conjunctival inflammation noted. Pupils equal round reactive to light and accommodation. Rectangular density OD . Extraocular motion intact. Vision grossly decreased OS. Ears: External  ear exam reveals no significant lesions or deformities. Canals clear .TMs normal. Hearing is grossly normal bilaterally. Nose: External nasal exam reveals no deformity or inflammation. Nasal mucosa are pink and moist. No lesions or exudates noted.   Mouth: Oral mucosa and oropharynx reveal no lesions or exudates. Teeth in good repair. Neck: No deformities, masses, or tenderness noted. Range of motion & Thyroid normalLungs: Normal respiratory effort; chest expands symmetrically. Lungs are clear to auscultation without rales, wheezes, or increased work of breathing. Heart: tachycardia; regular  rhythm. Slight gallop w/o  click or rub. Grade 1-1.5/6 systolic murmur  Abdomen: Bowel sounds normal; abdomen soft and nontender. No masses, organomegaly or hernias noted.C section op scar Genitalia: Dr Malva Limes                                                                      Musculoskeletal/extremities: No deformity or scoliosis noted of  the thoracic or  lumbar spine. No clubbing, cyanosis, or deformity noted. Range of motion  normal .Tone & strength  normal.Joints normal. Nail health  Good.1 + pedal edema Vascular: Carotid, radial artery, dorsalis pedis and  posterior tibial pulses are full and equal. No bruits present. Neurologic: Alert and oriented x3. Deep tendon reflexes symmetrical and normal.          Skin: Intact without suspicious lesions or rashes. Lymph: No cervical, axillary  lymphadenopathy present. Psych: Mood and affect are normal. Normally interactive                                                                                         Assessment & Plan:  #1 comprehensive physical exam; no acute findings #2 see Problem List with Assessments & Recommendations Plan: see Orders

## 2011-04-05 ENCOUNTER — Telehealth: Payer: Self-pay

## 2011-04-05 NOTE — Telephone Encounter (Signed)
Patient called to clarify her medication changes and instructions from last visit.  All questions were answered.

## 2011-08-31 ENCOUNTER — Ambulatory Visit
Admission: RE | Admit: 2011-08-31 | Discharge: 2011-08-31 | Disposition: A | Discharge status: Home / Self Care | Payer: Managed Care, Other (non HMO) | Source: Ambulatory Visit | Attending: Physician Assistant | Admitting: Physician Assistant

## 2011-08-31 ENCOUNTER — Other Ambulatory Visit: Payer: Self-pay | Admitting: Physician Assistant

## 2011-08-31 DIAGNOSIS — R52 Pain, unspecified: Secondary | ICD-10-CM

## 2011-08-31 DIAGNOSIS — R609 Edema, unspecified: Secondary | ICD-10-CM

## 2012-03-17 ENCOUNTER — Other Ambulatory Visit: Payer: Self-pay

## 2012-12-06 ENCOUNTER — Other Ambulatory Visit: Payer: Self-pay

## 2013-12-01 HISTORY — PX: OTHER SURGICAL HISTORY: SHX169

## 2013-12-10 ENCOUNTER — Other Ambulatory Visit: Payer: Self-pay | Admitting: *Deleted

## 2013-12-10 DIAGNOSIS — I10 Essential (primary) hypertension: Secondary | ICD-10-CM

## 2013-12-10 MED ORDER — LOSARTAN POTASSIUM 100 MG PO TABS: 100.0000 mg | ORAL_TABLET | Freq: Every day | ORAL | Status: DC

## 2013-12-10 MED ORDER — VERAPAMIL HCL 120 MG PO TABS: ORAL_TABLET | ORAL | Status: DC

## 2013-12-10 NOTE — Telephone Encounter (Signed)
Left msg on triage stating [facility]. Have made appt to see md 01/07/14. Called pt back no answer LMOM will send enough until appt...Johny Chess

## 2013-12-12 ENCOUNTER — Telehealth: Payer: Self-pay | Admitting: Internal Medicine

## 2013-12-12 DIAGNOSIS — N6452 Nipple discharge: Secondary | ICD-10-CM

## 2013-12-12 DIAGNOSIS — N644 Mastodynia: Secondary | ICD-10-CM

## 2013-12-12 NOTE — Telephone Encounter (Signed)
Pt called stated that she is experiencing pain in her breast and some discharge. Pt stated that OBGYN doctor that she wants to see need referral from Dr. Linna Darner first (will get more info of the doctor she wants to see). Please advise, last ov was 2013 but she has an appt 01/07/2014 for a physical. Please advise, does pt need to see you first or can you put in referral?

## 2013-12-12 NOTE — Telephone Encounter (Signed)
Called pt no answer LMOM RTC.../lmb 

## 2013-12-12 NOTE — Telephone Encounter (Signed)
Ok if [facility] will allow it ; otherwise she would need office to dictate note to cover referral Dx: breast pain & discharge

## 2013-12-12 NOTE — Telephone Encounter (Signed)
Pt called back stating she need a referral to go to the breast center not her GYN. Inform pt will place the referral once it get set-up she will received call bck...Johny Chess

## 2013-12-16 ENCOUNTER — Other Ambulatory Visit: Payer: Self-pay | Admitting: Internal Medicine

## 2013-12-16 DIAGNOSIS — N6452 Nipple discharge: Secondary | ICD-10-CM

## 2013-12-16 DIAGNOSIS — N644 Mastodynia: Secondary | ICD-10-CM

## 2013-12-22 ENCOUNTER — Emergency Department (INDEPENDENT_AMBULATORY_CARE_PROVIDER_SITE_OTHER)
Admission: EM | Admit: 2013-12-22 | Discharge: 2013-12-22 | Disposition: A | Discharge status: Home / Self Care | Payer: 59 | Source: Home / Self Care | Attending: Family Medicine | Admitting: Family Medicine

## 2013-12-22 ENCOUNTER — Encounter (HOSPITAL_COMMUNITY): Payer: Self-pay | Admitting: Emergency Medicine

## 2013-12-22 ENCOUNTER — Encounter (HOSPITAL_COMMUNITY): Payer: Self-pay | Admitting: Family Medicine

## 2013-12-22 ENCOUNTER — Emergency Department (HOSPITAL_COMMUNITY)
Admission: EM | Admit: 2013-12-22 | Discharge: 2013-12-22 | Disposition: A | Discharge status: Home / Self Care | Payer: 59 | Attending: Emergency Medicine | Admitting: Emergency Medicine

## 2013-12-22 DIAGNOSIS — N61 Inflammatory disorders of breast: Secondary | ICD-10-CM | POA: Insufficient documentation

## 2013-12-22 DIAGNOSIS — I10 Essential (primary) hypertension: Secondary | ICD-10-CM | POA: Diagnosis not present

## 2013-12-22 DIAGNOSIS — N63 Unspecified lump in unspecified breast: Secondary | ICD-10-CM

## 2013-12-22 DIAGNOSIS — Z79899 Other long term (current) drug therapy: Secondary | ICD-10-CM | POA: Insufficient documentation

## 2013-12-22 DIAGNOSIS — R000 Tachycardia, unspecified: Secondary | ICD-10-CM | POA: Diagnosis not present

## 2013-12-22 DIAGNOSIS — N6452 Nipple discharge: Secondary | ICD-10-CM

## 2013-12-22 DIAGNOSIS — Z862 Personal history of diseases of the blood and blood-forming organs and certain disorders involving the immune mechanism: Secondary | ICD-10-CM | POA: Diagnosis not present

## 2013-12-22 DIAGNOSIS — N611 Abscess of the breast and nipple: Secondary | ICD-10-CM

## 2013-12-22 LAB — BASIC METABOLIC PANEL
ANION GAP: 16 — AB (ref 5–15)
BUN: 9 mg/dL (ref 6–23)
CALCIUM: 10.3 mg/dL (ref 8.4–10.5)
CHLORIDE: 105 meq/L (ref 96–112)
CO2: 17 meq/L — AB (ref 19–32)
CREATININE: 0.58 mg/dL (ref 0.50–1.10)
GFR calc Af Amer: >90 mL/min (ref >90)
GFR calc non Af Amer: >90 mL/min (ref >90)
Glucose, Bld: 88 mg/dL (ref 70–99)
Potassium: 4.7 mEq/L (ref 3.7–5.3)
Sodium: 138 mEq/L (ref 137–147)

## 2013-12-22 LAB — CBC WITH DIFFERENTIAL/PLATELET
BASOS ABS: 0 10*3/uL (ref 0.0–0.1)
Basophils Relative: 0 % (ref 0–1)
EOS PCT: 0 % (ref 0–5)
Eosinophils Absolute: 0 10*3/uL (ref 0.0–0.7)
HEMATOCRIT: 37.8 % (ref 36.0–46.0)
HEMOGLOBIN: 12.8 g/dL (ref 12.0–15.0)
Lymphocytes Relative: 16 % (ref 12–46)
Lymphs Abs: 1.5 10*3/uL (ref 0.7–4.0)
MCH: 29.2 pg (ref 26.0–34.0)
MCHC: 33.9 g/dL (ref 30.0–36.0)
MCV: 86.1 fL (ref 78.0–100.0)
MONO ABS: 0.6 10*3/uL (ref 0.1–1.0)
MONOS PCT: 6 % (ref 3–12)
NEUTROS ABS: 7.3 10*3/uL (ref 1.7–7.7)
Neutrophils Relative %: 78 % — ABNORMAL HIGH (ref 43–77)
Platelets: 352 10*3/uL (ref 150–400)
RBC: 4.39 MIL/uL (ref 3.87–5.11)
RDW: 12.7 % (ref 11.5–15.5)
WBC: 9.4 10*3/uL (ref 4.0–10.5)

## 2013-12-22 MED ORDER — ONDANSETRON HCL 4 MG/2ML IJ SOLN
4.0000 mg | Freq: Once | INTRAMUSCULAR | Status: AC
  Administered 2013-12-22: 4 mg via INTRAVENOUS
  Filled 2013-12-22: qty 2

## 2013-12-22 MED ORDER — LIDOCAINE-EPINEPHRINE (PF) 2 %-1:200000 IJ SOLN
20.0000 mL | Freq: Once | INTRAMUSCULAR | Status: AC
  Administered 2013-12-22: 20 mL

## 2013-12-22 MED ORDER — DOXYCYCLINE HYCLATE 100 MG PO CAPS: 100.0000 mg | ORAL_CAPSULE | Freq: Two times a day (BID) | ORAL | Status: DC

## 2013-12-22 MED ORDER — HYDROCODONE-ACETAMINOPHEN 5-325 MG PO TABS: 2.0000 | ORAL_TABLET | ORAL | Status: DC | PRN

## 2013-12-22 MED ORDER — MORPHINE SULFATE 4 MG/ML IJ SOLN
4.0000 mg | Freq: Once | INTRAMUSCULAR | Status: AC
  Administered 2013-12-22: 4 mg via INTRAVENOUS
  Filled 2013-12-22: qty 1

## 2013-12-22 NOTE — ED Notes (Signed)
Pt states that she has had a breast abcess to the right breast and a discharge from the right nipple about a week ago.

## 2013-12-22 NOTE — Discharge Instructions (Signed)
I would like you to have yourself evaluated at [facility] ER upon being discharged from [facility]. I am concerned that your abscess is of the size that may require surgical drainage.

## 2013-12-22 NOTE — ED Provider Notes (Signed)
CSN: [id number]     Arrival date & time 12/22/13  1034 History   First MD Initiated Contact with Patient 12/22/13 1101     Chief Complaint  Patient presents with  . Breast Discharge  . Breast Mass   (Consider location/radiation/quality/duration/timing/severity/associated sxs/prior Treatment) HPI Comments: Patient presents stating she began to notice discharge from her right nipple about 2 weeks ago and some tenderness and firmness to under surface of right breast. In addition, she also notice inversion of her right nipple. States right breast mass has only become larger and more tender over the past two week. States she contacted her PCP on 12/19/2013 to be seen and was told no appointments available. She requested that she be scheduled for a mammogram and office called her on 12/20/2013 and was told they had scheduled for a mammogram on 12/25/2013. Mass continues to enlarge, therefore, she comes to [facility] for evaluation.  States last mammogram was in 2010.  PCP: Dr. Linna Darner Works in Psychologist, educational for [employer] and [place]  The history is provided by the patient.    Past Medical History  Diagnosis Date  . Allergy     perennial  . Hypertension   . Anemia     heavy menses  . Nonspecific elevation of levels of transaminase or lactic acid dehydrogenase (LDH) 2011    ALT 43  . Impaired fasting glucose 2011    108   Past Surgical History  Procedure Laterality Date  . Cesarean section    . Thumb sugery      trigger  . Colonoscopy  2009 & 2011    polyps 2009; negative 2011  . Dilation and curettage of uterus     Family History  Problem Relation Age of Onset  . Hypertension Mother   . Hypertension Father   . Stroke Father   . Alcohol abuse Brother    History  Substance Use Topics  . Smoking status: Never Smoker   . Smokeless tobacco: Not on file  . Alcohol Use: No   OB History    No data available     Review of Systems  Constitutional: Negative for fever, chills, appetite  change and unexpected weight change.  HENT: Negative.   Respiratory: Negative.   Cardiovascular: Negative.   Gastrointestinal: Negative.   Musculoskeletal: Negative for back pain.  Skin:       See hpi    Allergies  Review of patient's allergies indicates no known allergies.  Home Medications   Prior to Admission medications   Medication Sig Start Date End Date Taking? Authorizing Provider  cetirizine (ZYRTEC ALLERGY) 10 MG tablet Take 10 mg by mouth as needed.    Historical Provider, MD  fexofenadine (ALLEGRA) 180 MG tablet Take 1 tablet (180 mg total) by mouth daily as needed. 05/26/10   Hendricks Limes, MD  furosemide (LASIX) 40 MG tablet Take 1 tablet (40 mg total) by mouth daily. 03/30/11 03/29/12  Hendricks Limes, MD  losartan (COZAAR) 100 MG tablet Take 1 tablet (100 mg total) by mouth daily. 12/10/13 12/10/14  Hendricks Limes, MD  verapamil (CALAN) 120 MG tablet 1 bid 12/10/13   Hendricks Limes, MD   BP 195/68 mmHg  Pulse 118  Temp(Src) 98.5 F (36.9 C) (Oral)  Resp 16  SpO2 99%  LMP  Physical Exam  Constitutional: She is oriented to person, place, and time. She appears well-developed and well-nourished. No distress.  Hypertensive and tachycardic. Patient reports that she is quite nervous, but has  also missed recent doses of BP medication  HENT:  Head: Normocephalic and atraumatic.  Cardiovascular: Normal rate, regular rhythm and normal heart sounds.   Pulmonary/Chest: Effort normal and breath sounds normal. Right breast exhibits inverted nipple, mass, skin change and tenderness. Breasts are asymmetrical.    No palpable right axillary lymphadenopathy. Large firm minimally tender fist-sized mass at right lower lateral quadrant of breast.   Genitourinary: There is breast swelling and tenderness. Pelvic exam was performed with patient supine.  Musculoskeletal: Normal range of motion.  Neurological: She is alert and oriented to person, place, and time.  Skin: Skin is  dry.  Psychiatric: She has a normal mood and affect. Her behavior is normal.  Nursing note and vitals reviewed.   ED Course  Procedures (including critical care time) Labs Review Labs Reviewed - No data to display  Imaging Review No results found.   MDM   1. Breast mass in female   2. Breast abscess   3. Nipple discharge   This patient's exam is concerning for both breast abscess with associated breast cellulitis. In addition, I am also concerned about fist sized mass underlying superficial abscess because this is minimally tender and may be connected to recent nipple inversion. I would expect area of induration to be tender if related to cellulitis. Patient referred to [facility] ER for further evaluation. May require surgical consultation.     Lutricia Feil, [place] 12/22/13 1159

## 2013-12-22 NOTE — ED Notes (Signed)
Pt sts abscess to right breast. sts some drainage and soreness.

## 2013-12-22 NOTE — Progress Notes (Signed)
Rebecca Foley 11-26-1960 [id number]   Incision and Drainage Procedure Note  Pre-operative Diagnosis: right breast abscess  Post-operative Diagnosis: same  Indications: right breast swelling, fluctuance, tenderness  Anesthesia: 1% lidocaine with epinephrine, 4mg  IV morphine  Procedure Details  The procedure, risks and complications have been discussed in detail (including, but not limited to airway compromise, infection, bleeding) with the patient, and the patient has signed consent to the procedure.  The skin was sterilely prepped and draped over the affected area in the usual fashion. After adequate local anesthesia, I&D with a #11 blade was performed on the right breast at the NAC from 9-11 o'clock (1.5in). SeroPurulent drainage: present. Cultures obtained. Deep pocket for about 4cm. Packed with 1/4in iodoform gauze and covered with dry gauze and abd pad. Nurse present entire time The patient was observed until stable.  Findings: Large seropurulent breast abscess  EBL: 25 cc's  Drains: none  Condition: Tolerated procedure well   Complications: none.  Leighton Ruff. Redmond Pulling, MD, FACS General, Bariatric, & Minimally Invasive Surgery [facility] Surgery, [place]

## 2013-12-22 NOTE — Care Management (Signed)
ED CM consulted by Rebecca Meres PA-C concerning [facility] services for right breast dressing changes s/p I&D today in the ED.  Reviewed record and confirmed information with patient. Patient has [employer].Spoke with patient regarding [place] services recommendation, patient declined [place]  services.Patient stated, that she would have her daughter perform the daily dressing changes. Discussed the importance of aseptic technique, patient verbalized understanding reminded patient of the importance of f/u appt. Verbalized understanding.  Notified. EJenetta Downer' Mally PA-C. No further ED CM needs identified.

## 2013-12-22 NOTE — ED Notes (Signed)
Spoke with 1st Nurse Caryl Pina RN at  [facility] ED. Gave chief complaint on pt. Pt will be discharged from urgent and pt is going to the ED for further evaluation.

## 2013-12-22 NOTE — ED Provider Notes (Signed)
CSN: [id number]     Arrival date & time 12/22/13  1206 History  This chart was scribed for a non-physician practitioner, Pura Spice, working with Jasper Riling. Alvino Chapel, MD by [place] Peace, ED Scribe. The patient was seen in TR07C/TR07C. The patient's care was started at 2:26 PM.      Chief Complaint  Patient presents with  . Abscess      Patient is a 53 y.o. female presenting with abscess. The history is provided by the patient. No language interpreter was used.  Abscess Associated symptoms: no fever     HPI Comments: Rebecca Foley is a 53 y.o. female who presents to the Emergency Department complaining of abscess to right breast onset 1.5 weeks ago. Pt with history of white/yellow discharge from right nipple which resolved around 2 weeks ago. Pt noted that lesion developed on right breast that has gotten progressively larger and tender. Pt notes no drainage. Pt was evaluated at UC earlier today. She was informed of inversion of her right nipple. Pt denies any changes in nipple to her knowledge. No complaints of fever, chills, night sweats, abdominal pain, or weight lost. She states her last period was a few months ago. Most recent mammogram and pap smear was in 2009 or 2010. Pt with mammogram scheduled for 12/25/13. Pt reports history of hypertension.    Past Medical History  Diagnosis Date  . Allergy     perennial  . Hypertension   . Anemia     heavy menses  . Nonspecific elevation of levels of transaminase or lactic acid dehydrogenase (LDH) 2011    ALT 43  . Impaired fasting glucose 2011    108   Past Surgical History  Procedure Laterality Date  . Cesarean section    . Thumb sugery      trigger  . Colonoscopy  2009 & 2011    polyps 2009; negative 2011  . Dilation and curettage of uterus    . Incision and drainage of left breast     Family History  Problem Relation Age of Onset  . Hypertension Mother   . Hypertension Father   . Stroke Father   . Alcohol abuse  Brother    History  Substance Use Topics  . Smoking status: Never Smoker   . Smokeless tobacco: Not on file  . Alcohol Use: No   OB History    No data available     Review of Systems  Constitutional: Negative for fever, chills, diaphoresis and unexpected weight change.  Gastrointestinal: Negative for abdominal pain.  Skin:       Abscess to right breast.   All other systems reviewed and are negative.     Allergies  Review of patient's allergies indicates no known allergies.  Home Medications   Prior to Admission medications   Medication Sig Start Date End Date Taking? Authorizing Provider  ibuprofen (ADVIL,MOTRIN) 200 MG tablet Take 600 mg by mouth.   Yes Historical Provider, MD  losartan (COZAAR) 100 MG tablet Take 1 tablet (100 mg total) by mouth daily. 12/10/13 12/10/14 Yes Hendricks Limes, MD  verapamil (CALAN) 120 MG tablet 1 bid Patient taking differently: Take 120 mg by mouth 2 (two) times daily.  12/10/13  Yes Hendricks Limes, MD  cetirizine (ZYRTEC ALLERGY) 10 MG tablet Take 10 mg by mouth as needed.    Historical Provider, MD  doxycycline (VIBRAMYCIN) 100 MG capsule Take 1 capsule (100 mg total) by mouth 2 (two) times daily. 12/22/13  Pura Spice, PA-C  fexofenadine (ALLEGRA) 180 MG tablet Take 1 tablet (180 mg total) by mouth daily as needed. Patient not taking: Reported on 12/22/2013 05/26/10   Hendricks Limes, MD  furosemide (LASIX) 40 MG tablet Take 1 tablet (40 mg total) by mouth daily. Patient not taking: Reported on 12/22/2013 03/30/11 03/29/12  Hendricks Limes, MD  HYDROcodone-acetaminophen [facility]) 5-325 MG per tablet Take 2 tablets by mouth every 4 (four) hours as needed. 12/22/13   Jordan Hawks L Betul Brisky, PA-C   BP 193/89 mmHg  Pulse 109  Temp(Src) 98.7 F (37.1 C)  Resp 16  Ht 5\' 5"  (1.651 m)  Wt 250 lb (113.399 kg)  BMI 41.60 kg/m2  SpO2 99% Physical Exam  Constitutional: She appears well-developed and well-nourished. No distress.  HENT:   Head: Normocephalic and atraumatic.  Eyes: Conjunctivae and EOM are normal. Right eye exhibits no discharge. Left eye exhibits no discharge.  Cardiovascular: Normal rate, regular rhythm and normal heart sounds.   Pulmonary/Chest: Effort normal and breath sounds normal. No respiratory distress. She has no wheezes.  Abdominal: Soft. Bowel sounds are normal. She exhibits no distension. There is no tenderness.  Neurological: She is alert. She exhibits normal muscle tone. Coordination normal.  Skin: Skin is warm and dry. She is not diaphoretic.  Pt with 6cmx3cm fluctuance mass to right breast lateral to nipple. No drainage noted. Mild surrounding erythema. Significant induration surrounding and beneath abscess encompassing a large portion of breast. TTP with direct pressure to fluctuant mass. No axilla lymphadenopathy. Nurse in room for exam. Bilateral nipple inversion. No discharge from breast.  Nursing note and vitals reviewed.   ED Course  Procedures (including critical care time) Labs Review Labs Reviewed  CBC WITH DIFFERENTIAL - Abnormal; Notable for the following:    Neutrophils Relative % 78 (*)    All other components within normal limits  BASIC METABOLIC PANEL - Abnormal; Notable for the following:    CO2 17 (*)    Anion gap 16 (*)    All other components within normal limits  CULTURE, ROUTINE-ABSCESS    Imaging Review No results found.   EKG Interpretation None     Medications  morphine 4 MG/ML injection 4 mg (4 mg Intravenous Given 12/22/13 1650)  ondansetron (ZOFRAN) injection 4 mg (4 mg Intravenous Given 12/22/13 1650)  lidocaine-EPINEPHrine (XYLOCAINE W/EPI) 2 %-1:200000 (PF) injection 20 mL (20 mLs Infiltration Given 12/22/13 1731)    2:31 PM- Treatment plan was discussed with patient who verbalizes understanding and agrees.   MDM   Final diagnoses:  Breast abscess   Pt presenting with tender right breast abscess that has been enlarging for 1.5 weeks with  increasing tenderness. Pt afebrile with tachycardia. No white count. Consult to general surgery. Spoke with Saverio Danker PA-C at 3:22pm who agrees to evaluate the pt. She and Dr. Redmond Pulling performed a bed side I&D of breast abscess. Pt discharged with doxycycline and pain medications. Driving and sedation precautions provided. Pt instructed on wound care and dressing changes. Consult to case management for home health for dressing changes per request of surgery. Pt declined this. Pt states daughter will do this for her. Pt to follow up with General Surgery in 2 days for wound recheck. Mammogram rescheduled to be [place] with plan for future mammogram.   Discussed return precautions with patient. Discussed all results and patient verbalizes understanding and agrees with plan.  This is a shared patient. This patient was discussed with the physician, Dr. Alvino Chapel who  saw and evaluated the patient and agrees with the plan.  I personally performed the services described in this documentation, which was scribed in my presence. The recorded information has been reviewed and is accurate.   Pura Spice, PA-C 12/22/13 2141  [place] Alvino Chapel, [place] 12/30/13 [phone]

## 2013-12-22 NOTE — Consult Note (Signed)
Rebecca Foley 1960-11-06  [id number].   Requesting MD: Dr. Davonna Belling Chief Complaint/Reason for Consult: right breast abscess HPI: This is a 53 yo black female with a h/o HTN who began having right sided nipple drainage about 2 weeks ago.  This began to improve and the pain she was having with this improved as well.  Over the next week or so she really only had pain when she would hit this breast on things.  She has not had any fevers, sweats, or chills.  No other symptoms.  She went to the UC today for evaluation.  She was set up for a mammogram on this Wednesday, but was also referred to the [facility] for evaluation of this abscess.  We have been asked to see her.  ROS : Please see HPI, otherwise negative  Family History  Problem Relation Age of Onset  . Hypertension Mother   . Hypertension Father   . Stroke Father   . Alcohol abuse Brother   no h/o breast, colon, or prostate cancer  Past Medical History  Diagnosis Date  . Allergy     perennial  . Hypertension   . Anemia     heavy menses  . Nonspecific elevation of levels of transaminase or lactic acid dehydrogenase (LDH) 2011    ALT 43  . Impaired fasting glucose 2011    108    Past Surgical History  Procedure Laterality Date  . Cesarean section    . Thumb sugery      trigger  . Colonoscopy  2009 & 2011    polyps 2009; negative 2011  . Dilation and curettage of uterus    . Incision and drainage of left breast      Social History:  reports that she has never smoked. She does not have any smokeless tobacco history on file. She reports that she does not drink alcohol or use illicit drugs.  Allergies: No Known Allergies   (Not in a hospital admission)  Blood pressure 193/89, pulse 109, temperature 98.7 F (37.1 C), resp. rate 16, height _0  (1.651 m), weight 250 lb (113.399 kg), SpO2 99 %. Physical Exam: General: pleasant, obese black female who is laying in bed in NAD HEENT: head is normocephalic, atraumatic.   Sclera are noninjected.  PERRL.  Ears and nose without any masses or lesions.  Mouth is pink and moist Heart: regular, rhythm, but mildly tachycaridc.  Normal s1,s2. No obvious murmurs, gallops, or rubs noted.  Palpable radial and pedal pulses bilaterally Lungs: CTAB, no wheezes, rhonchi, or rales noted.  Respiratory effort nonlabored Chest: right breast with some edema and a fluctuant superficial abscess noted just lateral to the nipple areolar complex at 9 oclock.  This is about 3x2cm.  She however has a large area of induration vs possible mass deep to this.  No nipple discharge.  No palpable lymphadenopathy. Abd: soft, NT, ND, +BS, no masses, hernias, or organomegaly Psych: A&Ox3 with an appropriate affect.    Results for orders placed or performed during the hospital encounter of 12/22/13 (from the past 48 hour(s))  CBC with Differential     Status: Abnormal   Collection Time: 12/22/13  3:30 PM  Result Value Ref Range   WBC 9.4 4.0 - 10.5 K/uL   RBC 4.39 3.87 - 5.11 MIL/uL   Hemoglobin 12.8 12.0 - 15.0 g/dL   HCT 37.8 36.0 - 46.0 %   MCV 86.1 78.0 - 100.0 fL   MCH 29.2 26.0 - 34.0  pg   MCHC 33.9 30.0 - 36.0 g/dL   RDW 12.7 11.5 - 15.5 %   Platelets 352 150 - 400 K/uL   Neutrophils Relative % 78 (H) 43 - 77 %   Neutro Abs 7.3 1.7 - 7.7 K/uL   Lymphocytes Relative 16 12 - 46 %   Lymphs Abs 1.5 0.7 - 4.0 K/uL   Monocytes Relative 6 3 - 12 %   Monocytes Absolute 0.6 0.1 - 1.0 K/uL   Eosinophils Relative 0 0 - 5 %   Eosinophils Absolute 0.0 0.0 - 0.7 K/uL   Basophils Relative 0 0 - 1 %   Basophils Absolute 0.0 0.0 - 0.1 K/uL  Basic metabolic panel     Status: Abnormal   Collection Time: 12/22/13  3:30 PM  Result Value Ref Range   Sodium 138 137 - 147 mEq/L   Potassium 4.7 3.7 - 5.3 mEq/L   Chloride 105 96 - 112 mEq/L   CO2 17 (L) 19 - 32 mEq/L   Glucose, Bld 88 70 - 99 mg/dL   BUN 9 6 - 23 mg/dL   Creatinine, Ser 0.58 0.50 - 1.10 mg/dL   Calcium 10.3 8.4 - 10.5 mg/dL   GFR  calc non Af Amer >90 >90 mL/min   GFR calc Af Amer >90 >90 mL/min    Comment: (NOTE) The eGFR has been calculated using the CKD EPI equation. This calculation has not been validated in all clinical situations. eGFR's persistently <90 mL/min signify possible Chronic Kidney Disease.    Anion gap 16 (H) 5 - 15   No results found.     Assessment/Plan 1. Right breast abscess, with possible induration vs underlying mass 2. HTN  Plan: 1. We will plan on bedside I&D of right breast abscess and send her home with abx therapy and HH for dressing changes.  She will also need to follow up with [place] on Tuesday for a wound check.  We will cancel her mammogram as we do not feel she will be able to tolerate this right now given her current infection.  We will change this to a breast ultrasound to start with and she will then need a mammogram at some point in the future.  We have discussed all of this with the patient.  She understands and is agreeable to proceed.  Kerry Chisolm E 12/22/2013, 4:32 PM Pager: [phone]

## 2013-12-22 NOTE — Discharge Instructions (Signed)
Return to the emergency room with worsening of symptoms, new symptoms or with symptoms that are concerning. Follow up with general surgery on Tuesday 12/24/13 with Saverio Danker. Please take all of your antibiotics until finished!   You may develop abdominal discomfort or diarrhea from the antibiotic.  You may help offset this with probiotics which you can buy or get in yogurt. Do not eat  or take the probiotics until 2 hours after your antibiotic.  [place] for severe pain. Do not operate machinery, drive or drink alcohol while taking narcotics or muscle relaxers. Home health will come to your home for dressing changes.   Abscess An abscess is an infected area that contains a collection of pus and debris.It can occur in almost any part of the body. An abscess is also known as a furuncle or boil. CAUSES  An abscess occurs when tissue gets infected. This can occur from blockage of oil or sweat glands, infection of hair follicles, or a minor injury to the skin. As the body tries to fight the infection, pus collects in the area and creates pressure under the skin. This pressure causes pain. People with weakened immune systems have difficulty fighting infections and get certain abscesses more often.  SYMPTOMS Usually an abscess develops on the skin and becomes a painful mass that is red, warm, and tender. If the abscess forms under the skin, you may feel a moveable soft area under the skin. Some abscesses break open (rupture) on their own, but most will continue to get worse without care. The infection can spread deeper into the body and eventually into the bloodstream, causing you to feel ill.  DIAGNOSIS  Your caregiver will take your medical history and perform a physical exam. A sample of fluid may also be taken from the abscess to determine what is causing your infection. TREATMENT  Your caregiver may prescribe antibiotic medicines to fight the infection. However, taking antibiotics alone usually does  not cure an abscess. Your caregiver may need to make a small cut (incision) in the abscess to drain the pus. In some cases, gauze is packed into the abscess to reduce pain and to continue draining the area. HOME CARE INSTRUCTIONS   Only take over-the-counter or prescription medicines for pain, discomfort, or fever as directed by your caregiver.  If you were prescribed antibiotics, take them as directed. Finish them even if you start to feel better.  If gauze is used, follow your caregiver's directions for changing the gauze.  To avoid spreading the infection:  Keep your draining abscess covered with a bandage.  Wash your hands well.  Do not share personal care items, towels, or whirlpools with others.  Avoid skin contact with others.  Keep your skin and clothes clean around the abscess.  Keep all follow-up appointments as directed by your caregiver. SEEK MEDICAL CARE IF:   You have increased pain, swelling, redness, fluid drainage, or bleeding.  You have muscle aches, chills, or a general ill feeling.  You have a fever. MAKE SURE YOU:   Understand these instructions.  Will watch your condition.  Will get help right away if you are not doing well or get worse. Document Released: 10/27/2004 Document Revised: 07/19/2011 Document Reviewed: 04/01/2011 [facility] Patient Information 2015 [place], [place]. This information is not intended to replace advice given to you by your health care provider. Make sure you discuss any questions you have with your health care provider.

## 2013-12-22 NOTE — ED Notes (Signed)
Declined W/C at D/C and was escorted to lobby by RN. 

## 2013-12-25 ENCOUNTER — Other Ambulatory Visit: Payer: Managed Care, Other (non HMO)

## 2013-12-26 LAB — CULTURE, ROUTINE-ABSCESS

## 2014-01-07 ENCOUNTER — Ambulatory Visit (INDEPENDENT_AMBULATORY_CARE_PROVIDER_SITE_OTHER): Payer: 59 | Admitting: Internal Medicine

## 2014-01-07 ENCOUNTER — Encounter: Payer: Self-pay | Admitting: Internal Medicine

## 2014-01-07 ENCOUNTER — Other Ambulatory Visit (INDEPENDENT_AMBULATORY_CARE_PROVIDER_SITE_OTHER): Payer: 59

## 2014-01-07 VITALS — BP 160/90 | HR 119 | Temp 98.3°F | Resp 13 | Ht 65.0 in | Wt 265.0 lb

## 2014-01-07 DIAGNOSIS — I10 Essential (primary) hypertension: Secondary | ICD-10-CM

## 2014-01-07 DIAGNOSIS — Z0189 Encounter for other specified special examinations: Secondary | ICD-10-CM

## 2014-01-07 DIAGNOSIS — J302 Other seasonal allergic rhinitis: Secondary | ICD-10-CM

## 2014-01-07 DIAGNOSIS — J309 Allergic rhinitis, unspecified: Secondary | ICD-10-CM

## 2014-01-07 DIAGNOSIS — Z0000 Encounter for general adult medical examination without abnormal findings: Secondary | ICD-10-CM

## 2014-01-07 DIAGNOSIS — J3089 Other allergic rhinitis: Secondary | ICD-10-CM

## 2014-01-07 LAB — CBC WITH DIFFERENTIAL/PLATELET
Basophils Absolute: 0 10*3/uL (ref 0.0–0.1)
Basophils Relative: 0.5 % (ref 0.0–3.0)
Eosinophils Absolute: 0.2 10*3/uL (ref 0.0–0.7)
Eosinophils Relative: 3 % (ref 0.0–5.0)
HEMATOCRIT: 40.3 % (ref 36.0–46.0)
Hemoglobin: 13.2 g/dL (ref 12.0–15.0)
LYMPHS ABS: 1.9 10*3/uL (ref 0.7–4.0)
Lymphocytes Relative: 27.6 % (ref 12.0–46.0)
MCHC: 32.7 g/dL (ref 30.0–36.0)
MCV: 87.8 fl (ref 78.0–100.0)
MONO ABS: 0.4 10*3/uL (ref 0.1–1.0)
MONOS PCT: 6.1 % (ref 3.0–12.0)
Neutro Abs: 4.3 10*3/uL (ref 1.4–7.7)
Neutrophils Relative %: 62.8 % (ref 43.0–77.0)
PLATELETS: 301 10*3/uL (ref 150.0–400.0)
RBC: 4.59 Mil/uL (ref 3.87–5.11)
RDW: 13.5 % (ref 11.5–15.5)
WBC: 6.9 10*3/uL (ref 4.0–10.5)

## 2014-01-07 LAB — LIPID PANEL
Cholesterol: 137 mg/dL (ref 0–200)
HDL: 42.3 mg/dL (ref >39.00)
LDL CALC: 81 mg/dL (ref 0–99)
NonHDL: 94.7
Total CHOL/HDL Ratio: 3
Triglycerides: 71 mg/dL (ref 0.0–149.0)
VLDL: 14.2 mg/dL (ref 0.0–40.0)

## 2014-01-07 LAB — BASIC METABOLIC PANEL
BUN: 10 mg/dL (ref 6–23)
CHLORIDE: 107 meq/L (ref 96–112)
CO2: 24 mEq/L (ref 19–32)
Calcium: 10 mg/dL (ref 8.4–10.5)
Creatinine, Ser: 0.8 mg/dL (ref 0.4–1.2)
GFR: 96.95 mL/min (ref >60.00)
Glucose, Bld: 102 mg/dL — ABNORMAL HIGH (ref 70–99)
POTASSIUM: 4.1 meq/L (ref 3.5–5.1)
SODIUM: 139 meq/L (ref 135–145)

## 2014-01-07 LAB — HEPATIC FUNCTION PANEL
ALT: 26 U/L (ref 0–35)
AST: 21 U/L (ref 0–37)
Albumin: 4 g/dL (ref 3.5–5.2)
Alkaline Phosphatase: 60 U/L (ref 39–117)
BILIRUBIN TOTAL: 0.3 mg/dL (ref 0.2–1.2)
Bilirubin, Direct: 0.1 mg/dL (ref 0.0–0.3)
Total Protein: 7.6 g/dL (ref 6.0–8.3)

## 2014-01-07 LAB — T4, FREE: FREE T4: 0.75 ng/dL (ref 0.60–1.60)

## 2014-01-07 LAB — TSH: TSH: 1.76 u[IU]/mL (ref 0.35–4.50)

## 2014-01-07 MED ORDER — LOSARTAN POTASSIUM 100 MG PO TABS: 100.0000 mg | ORAL_TABLET | Freq: Every day | ORAL | Status: DC

## 2014-01-07 MED ORDER — VERAPAMIL HCL 120 MG PO TABS: ORAL_TABLET | ORAL | Status: DC

## 2014-01-07 NOTE — Progress Notes (Signed)
Subjective:    Patient ID: Rebecca Foley, female    DOB: 09/07/1960, 53 y.o.   MRN: [id number]  HPI  She is here for a physical;acute issues denied.   See BP & pulse; she claims "White Coat Syndrome" as cause. Her compliance with her blood pressure medicines & diuretic as been markedly variable. She questioned whether these were causing excessive urination and urgency at work as well as cramps in hands & legs.  She does not eat red meat but eats fried foods. She does restrict salt.  She's not on any exercise program  Blood pressure averages 160/80.  There is no family history of premature cardiac disease or stroke.  In 2011 she had a hyperplastic polyp removed; she'll be due for follow-up in 2021.     Review of Systems   Chest pain, palpitations, tachycardia, exertional dyspnea, paroxysmal nocturnal dyspnea, claudication or edema are absent.  Unexplained weight loss, abdominal pain, significant dyspepsia, dysphagia, melena, rectal bleeding, or persistently small caliber stools are denied.     Objective:   Physical Exam Gen.: Healthy and well-nourished in appearance. Alert, appropriate and cooperative throughout exam. As per CDC Guidelines ,Epic documents severe obesity as being present . Appears younger than stated age  Head: Normocephalic without obvious abnormalities  Eyes: No corneal or conjunctival inflammation noted. Pupils equal round reactive to light and accommodation. Extraocular motion intact.  Ears: External  ear exam reveals no significant lesions or deformities. Canals clear .TMs normal. Hearing is grossly normal bilaterally. Nose: External nasal exam reveals no deformity or inflammation. Nasal mucosa are pink and moist. No lesions or exudates noted.   Mouth: Oral mucosa and oropharynx reveal no lesions or exudates. Teeth in good repair. Neck: No deformities, masses, or tenderness noted. Range of motion & Thyroid normal Lungs: Normal respiratory effort; chest  expands symmetrically. Lungs are clear to auscultation without rales, wheezes, or increased work of breathing. Heart:Fast rate. Normal rhythm. Normal S1 and S2. No gallop, click, or rub. Flow murmur. Abdomen: Protuberant.Bowel sounds normal; abdomen soft and nontender. No masses, organomegaly or hernias noted. Genitalia:  as per Gyn  (She is OVERDUE !. Brother had prostate cancer)                                Musculoskeletal/extremities: No deformity or scoliosis noted of  the thoracic or lumbar spine.  No clubbing, cyanosis, edema, or significant extremity  deformity noted.  Range of motion normal . Tone & strength normal. Hand joints normal.  Fingernail  health good. Crepitus of knees ;varus changes Able to lie down & sit up w/o help.  Negative SLR bilaterally Vascular: Carotid, radial artery, dorsalis pedis and  posterior tibial pulses are full and equal. No bruits present. Neurologic: Alert and oriented x3. Deep tendon reflexes symmetrical and normal.  Gait normal Skin: Intact without suspicious lesions or rashes. Lymph: No cervical, axillary lymphadenopathy present. Psych: Mood and affect are normal. Normally interactive  Assessment & Plan:  #1 comprehensive physical exam; no acute findings  Plan: see Orders  & Recommendations

## 2014-01-07 NOTE — Progress Notes (Signed)
Pre visit review using our clinic review tool, if applicable. No additional management support is needed unless otherwise documented below in the visit note. 

## 2014-01-07 NOTE — Patient Instructions (Signed)
Your next office appointment will be determined based upon review of your pending labs . Those instructions will be transmitted to you through My Chart   Minimal Blood Pressure Goal= AVERAGE < 140/90;  Ideal is an AVERAGE < 135/85. This AVERAGE should be calculated from @ least 5-7 BP readings taken @ different times of day on different days of week. You should not respond to isolated BP readings , but rather the AVERAGE for that week .Please bring your  blood pressure cuff to office visits to verify that it is reliable.It  can also be checked against the blood pressure device at the pharmacy. Finger or wrist cuffs are not dependable; an arm cuff is. Plain Mucinex (NOT D) for thick secretions ;force NON dairy fluids .   Nasal cleansing in the shower as discussed with lather of mild shampoo.After 10 seconds wash off lather while  exhaling through nostrils. Make sure that all residual soap is removed to prevent irritation.  Flonase OR Nasacort AQ 1 spray in each nostril twice a day as needed. Use the "crossover" technique into opposite nostril spraying toward opposite ear @ 45 degree angle, not straight up into nostril.  Use a Neti pot daily only  as needed for significant sinus congestion; going from open side to congested side . Plain Allegra (NOT D )  160 daily , Loratidine 10 mg , OR Zyrtec 10 mg @ bedtime  as needed for itchy eyes & sneezing.

## 2014-01-08 ENCOUNTER — Other Ambulatory Visit: Payer: Self-pay | Admitting: Internal Medicine

## 2014-01-08 ENCOUNTER — Other Ambulatory Visit (INDEPENDENT_AMBULATORY_CARE_PROVIDER_SITE_OTHER): Payer: 59

## 2014-01-08 ENCOUNTER — Telehealth: Payer: Self-pay

## 2014-01-08 DIAGNOSIS — R739 Hyperglycemia, unspecified: Secondary | ICD-10-CM

## 2014-01-08 DIAGNOSIS — R7303 Prediabetes: Secondary | ICD-10-CM

## 2014-01-08 LAB — HEMOGLOBIN A1C: HEMOGLOBIN A1C: 6.2 % (ref 4.6–6.5)

## 2014-01-08 NOTE — Telephone Encounter (Signed)
Request for add on has been faxed to lab 

## 2014-01-08 NOTE — Telephone Encounter (Signed)
-----   Message from Hendricks Limes, MD sent at 01/08/2014  6:37 AM EST ----- Please add A1c (R73.9)

## 2014-02-01 ENCOUNTER — Other Ambulatory Visit: Payer: Self-pay | Admitting: Internal Medicine

## 2014-03-12 ENCOUNTER — Ambulatory Visit
Admission: RE | Admit: 2014-03-12 | Discharge: 2014-03-12 | Disposition: A | Discharge status: Home / Self Care | Payer: 59 | Source: Ambulatory Visit | Attending: Internal Medicine | Admitting: Internal Medicine

## 2014-03-12 ENCOUNTER — Other Ambulatory Visit: Payer: Self-pay | Admitting: Internal Medicine

## 2014-03-12 DIAGNOSIS — N644 Mastodynia: Secondary | ICD-10-CM

## 2014-03-12 DIAGNOSIS — N6452 Nipple discharge: Secondary | ICD-10-CM

## 2014-03-12 DIAGNOSIS — N6001 Solitary cyst of right breast: Secondary | ICD-10-CM

## 2014-03-13 ENCOUNTER — Other Ambulatory Visit: Payer: Self-pay

## 2014-03-16 LAB — CULTURE, ROUTINE-ABSCESS
Culture: NO GROWTH
Culture: NO GROWTH
Special Requests: NORMAL

## 2014-03-26 ENCOUNTER — Other Ambulatory Visit: Payer: Self-pay | Admitting: Internal Medicine

## 2014-03-26 ENCOUNTER — Ambulatory Visit
Admission: RE | Admit: 2014-03-26 | Discharge: 2014-03-26 | Disposition: A | Discharge status: Home / Self Care | Payer: 59 | Source: Ambulatory Visit | Attending: Internal Medicine | Admitting: Internal Medicine

## 2014-03-26 DIAGNOSIS — N6001 Solitary cyst of right breast: Secondary | ICD-10-CM

## 2014-03-28 ENCOUNTER — Ambulatory Visit
Admission: RE | Admit: 2014-03-28 | Discharge: 2014-03-28 | Disposition: A | Discharge status: Home / Self Care | Payer: 59 | Source: Ambulatory Visit | Attending: Internal Medicine | Admitting: Internal Medicine

## 2014-03-28 DIAGNOSIS — N6001 Solitary cyst of right breast: Secondary | ICD-10-CM

## 2014-05-27 ENCOUNTER — Encounter (HOSPITAL_BASED_OUTPATIENT_CLINIC_OR_DEPARTMENT_OTHER): Payer: Self-pay | Admitting: *Deleted

## 2014-05-27 NOTE — Progress Notes (Signed)
To [facility] at 0600-Istat on arrival,Ekg and last office visit with chart.Orders pending from Dr Bea Laura Npo after Mn-will take cozaar,calan with small amt water in am.

## 2014-06-16 ENCOUNTER — Encounter (HOSPITAL_COMMUNITY): Payer: Self-pay | Admitting: *Deleted

## 2014-06-16 ENCOUNTER — Ambulatory Visit: Payer: Self-pay | Admitting: Surgery

## 2014-06-16 MED ORDER — DEXTROSE 5 % IV SOLN
3.0000 g | INTRAVENOUS | Status: AC
  Administered 2014-06-17: 3 g via INTRAVENOUS
  Filled 2014-06-16 (×2): qty 3000

## 2014-06-16 NOTE — Progress Notes (Signed)
Called Dr. Dois Davenport office to request pre-op orders, spoke with Sunday Spillers, she states she will send Dr. Redmond Pulling a message.

## 2014-06-17 ENCOUNTER — Ambulatory Visit (HOSPITAL_COMMUNITY): Payer: 59 | Admitting: Certified Registered"

## 2014-06-17 ENCOUNTER — Encounter (HOSPITAL_COMMUNITY)
Admission: RE | Disposition: A | Discharge status: Home / Self Care | Payer: Self-pay | Source: Ambulatory Visit | Attending: General Surgery

## 2014-06-17 ENCOUNTER — Ambulatory Visit (HOSPITAL_COMMUNITY)
Admission: RE | Admit: 2014-06-17 | Discharge: 2014-06-17 | Disposition: A | Discharge status: Home / Self Care | Payer: 59 | Source: Ambulatory Visit | Attending: General Surgery | Admitting: General Surgery

## 2014-06-17 ENCOUNTER — Encounter (HOSPITAL_COMMUNITY): Payer: Self-pay | Admitting: *Deleted

## 2014-06-17 DIAGNOSIS — I10 Essential (primary) hypertension: Secondary | ICD-10-CM | POA: Insufficient documentation

## 2014-06-17 DIAGNOSIS — N61 Inflammatory disorders of breast: Secondary | ICD-10-CM | POA: Insufficient documentation

## 2014-06-17 DIAGNOSIS — Z79899 Other long term (current) drug therapy: Secondary | ICD-10-CM | POA: Diagnosis not present

## 2014-06-17 DIAGNOSIS — Z792 Long term (current) use of antibiotics: Secondary | ICD-10-CM | POA: Diagnosis not present

## 2014-06-17 DIAGNOSIS — Z6841 Body Mass Index (BMI) 40.0 and over, adult: Secondary | ICD-10-CM | POA: Insufficient documentation

## 2014-06-17 HISTORY — PX: INCISION AND DRAINAGE ABSCESS: SHX5864

## 2014-06-17 LAB — CBC
HCT: 37.8 % (ref 36.0–46.0)
Hemoglobin: 12.5 g/dL (ref 12.0–15.0)
MCH: 28.4 pg (ref 26.0–34.0)
MCHC: 33.1 g/dL (ref 30.0–36.0)
MCV: 85.9 fL (ref 78.0–100.0)
Platelets: 282 10*3/uL (ref 150–400)
RBC: 4.4 MIL/uL (ref 3.87–5.11)
RDW: 13.8 % (ref 11.5–15.5)
WBC: 7.1 10*3/uL (ref 4.0–10.5)

## 2014-06-17 LAB — BASIC METABOLIC PANEL
Anion gap: 9 (ref 5–15)
BUN: 10 mg/dL (ref 6–20)
CO2: 24 mmol/L (ref 22–32)
Calcium: 9.8 mg/dL (ref 8.9–10.3)
Chloride: 107 mmol/L (ref 101–111)
Creatinine, Ser: 0.74 mg/dL (ref 0.44–1.00)
GFR calc Af Amer: >60 mL/min (ref >60)
Glucose, Bld: 100 mg/dL — ABNORMAL HIGH (ref 65–99)
POTASSIUM: 4.1 mmol/L (ref 3.5–5.1)
Sodium: 140 mmol/L (ref 135–145)

## 2014-06-17 LAB — HCG, SERUM, QUALITATIVE: PREG SERUM: NEGATIVE

## 2014-06-17 SURGERY — INCISION AND DRAINAGE, ABSCESS
Anesthesia: General | Site: Breast | Laterality: Right

## 2014-06-17 MED ORDER — FENTANYL CITRATE (PF) 100 MCG/2ML IJ SOLN
INTRAMUSCULAR | Status: AC
  Filled 2014-06-17: qty 2

## 2014-06-17 MED ORDER — PHENYLEPHRINE 40 MCG/ML (10ML) SYRINGE FOR IV PUSH (FOR BLOOD PRESSURE SUPPORT)
PREFILLED_SYRINGE | INTRAVENOUS | Status: AC
  Filled 2014-06-17: qty 10

## 2014-06-17 MED ORDER — 0.9 % SODIUM CHLORIDE (POUR BTL) OPTIME
TOPICAL | Status: DC | PRN
  Administered 2014-06-17: 1000 mL

## 2014-06-17 MED ORDER — FENTANYL CITRATE (PF) 100 MCG/2ML IJ SOLN
25.0000 ug | INTRAMUSCULAR | Status: DC | PRN
  Administered 2014-06-17: 50 ug via INTRAVENOUS

## 2014-06-17 MED ORDER — SULFAMETHOXAZOLE-TRIMETHOPRIM 800-160 MG PO TABS
1.0000 | ORAL_TABLET | Freq: Two times a day (BID) | ORAL | Status: DC
Start: 2014-06-17 — End: 2014-07-17

## 2014-06-17 MED ORDER — MIDAZOLAM HCL 2 MG/2ML IJ SOLN
INTRAMUSCULAR | Status: AC
  Filled 2014-06-17: qty 2

## 2014-06-17 MED ORDER — ONDANSETRON HCL 4 MG/2ML IJ SOLN
INTRAMUSCULAR | Status: AC
  Filled 2014-06-17: qty 4

## 2014-06-17 MED ORDER — SODIUM CHLORIDE 0.9 % IJ SOLN: 3.0000 mL | Freq: Two times a day (BID) | INTRAMUSCULAR | Status: DC

## 2014-06-17 MED ORDER — SODIUM CHLORIDE 0.9 % IV SOLN: 250.0000 mL | INTRAVENOUS | Status: DC | PRN

## 2014-06-17 MED ORDER — CHLORHEXIDINE GLUCONATE 4 % EX LIQD
1.0000 "application " | Freq: Once | CUTANEOUS | Status: DC
  Filled 2014-06-17: qty 15

## 2014-06-17 MED ORDER — LACTATED RINGERS IV SOLN
INTRAVENOUS | Status: DC
  Administered 2014-06-17 (×2): via INTRAVENOUS

## 2014-06-17 MED ORDER — ONDANSETRON HCL 4 MG/2ML IJ SOLN
INTRAMUSCULAR | Status: DC | PRN
  Administered 2014-06-17: 4 mg via INTRAVENOUS

## 2014-06-17 MED ORDER — PROPOFOL 10 MG/ML IV BOLUS
INTRAVENOUS | Status: AC
  Filled 2014-06-17: qty 20

## 2014-06-17 MED ORDER — SODIUM CHLORIDE 0.9 % IJ SOLN: 3.0000 mL | INTRAMUSCULAR | Status: DC | PRN

## 2014-06-17 MED ORDER — ACETAMINOPHEN 325 MG PO TABS
650.0000 mg | ORAL_TABLET | ORAL | Status: DC | PRN
  Filled 2014-06-17: qty 2

## 2014-06-17 MED ORDER — ACETAMINOPHEN 650 MG RE SUPP
650.0000 mg | RECTAL | Status: DC | PRN
  Filled 2014-06-17: qty 1

## 2014-06-17 MED ORDER — FENTANYL CITRATE (PF) 250 MCG/5ML IJ SOLN
INTRAMUSCULAR | Status: AC
  Filled 2014-06-17: qty 5

## 2014-06-17 MED ORDER — SODIUM CHLORIDE 0.9 % IJ SOLN
INTRAMUSCULAR | Status: AC
  Filled 2014-06-17: qty 20

## 2014-06-17 MED ORDER — BUPIVACAINE-EPINEPHRINE (PF) 0.25% -1:200000 IJ SOLN
INTRAMUSCULAR | Status: AC
  Filled 2014-06-17: qty 30

## 2014-06-17 MED ORDER — MORPHINE SULFATE 2 MG/ML IJ SOLN: 1.0000 mg | INTRAMUSCULAR | Status: DC | PRN

## 2014-06-17 MED ORDER — OXYCODONE HCL 5 MG/5ML PO SOLN: 5.0000 mg | Freq: Once | ORAL | Status: AC | PRN

## 2014-06-17 MED ORDER — KETOROLAC TROMETHAMINE 30 MG/ML IJ SOLN
INTRAMUSCULAR | Status: AC
  Filled 2014-06-17: qty 1

## 2014-06-17 MED ORDER — SUCCINYLCHOLINE CHLORIDE 20 MG/ML IJ SOLN
INTRAMUSCULAR | Status: AC
  Filled 2014-06-17: qty 3

## 2014-06-17 MED ORDER — NEOSTIGMINE METHYLSULFATE 10 MG/10ML IV SOLN
INTRAVENOUS | Status: AC
  Filled 2014-06-17: qty 1

## 2014-06-17 MED ORDER — STERILE WATER FOR INJECTION IJ SOLN
INTRAMUSCULAR | Status: AC
  Filled 2014-06-17: qty 10

## 2014-06-17 MED ORDER — SUCCINYLCHOLINE CHLORIDE 20 MG/ML IJ SOLN
INTRAMUSCULAR | Status: DC | PRN
  Administered 2014-06-17: 100 mg via INTRAVENOUS

## 2014-06-17 MED ORDER — ARTIFICIAL TEARS OP OINT
TOPICAL_OINTMENT | OPHTHALMIC | Status: AC
  Filled 2014-06-17: qty 10.5

## 2014-06-17 MED ORDER — PROPOFOL 10 MG/ML IV BOLUS
INTRAVENOUS | Status: DC | PRN
  Administered 2014-06-17: 200 mg via INTRAVENOUS
  Administered 2014-06-17: 100 mg via INTRAVENOUS

## 2014-06-17 MED ORDER — LIDOCAINE HCL (CARDIAC) 20 MG/ML IV SOLN
INTRAVENOUS | Status: DC | PRN
  Administered 2014-06-17: 50 mg via INTRAVENOUS

## 2014-06-17 MED ORDER — LIDOCAINE HCL (CARDIAC) 20 MG/ML IV SOLN
INTRAVENOUS | Status: AC
  Filled 2014-06-17: qty 30

## 2014-06-17 MED ORDER — MIDAZOLAM HCL 5 MG/5ML IJ SOLN
INTRAMUSCULAR | Status: DC | PRN
  Administered 2014-06-17: 2 mg via INTRAVENOUS

## 2014-06-17 MED ORDER — PHENYLEPHRINE HCL 10 MG/ML IJ SOLN
INTRAMUSCULAR | Status: DC | PRN
  Administered 2014-06-17: 100 ug via INTRAVENOUS

## 2014-06-17 MED ORDER — ROCURONIUM BROMIDE 50 MG/5ML IV SOLN
INTRAVENOUS | Status: AC
  Filled 2014-06-17: qty 5

## 2014-06-17 MED ORDER — OXYCODONE HCL 5 MG PO TABS: 5.0000 mg | ORAL_TABLET | Freq: Once | ORAL | Status: AC | PRN

## 2014-06-17 MED ORDER — ONDANSETRON HCL 4 MG/2ML IJ SOLN: 4.0000 mg | Freq: Four times a day (QID) | INTRAMUSCULAR | Status: DC | PRN

## 2014-06-17 MED ORDER — ROCURONIUM BROMIDE 50 MG/5ML IV SOLN
INTRAVENOUS | Status: AC
  Filled 2014-06-17: qty 1

## 2014-06-17 MED ORDER — KETOROLAC TROMETHAMINE 30 MG/ML IJ SOLN
30.0000 mg | Freq: Four times a day (QID) | INTRAMUSCULAR | Status: DC
  Administered 2014-06-17: 30 mg via INTRAVENOUS

## 2014-06-17 MED ORDER — OXYCODONE-ACETAMINOPHEN 5-325 MG PO TABS: 1.0000 | ORAL_TABLET | ORAL | Status: DC | PRN

## 2014-06-17 MED ORDER — OXYCODONE HCL 5 MG PO TABS: 5.0000 mg | ORAL_TABLET | ORAL | Status: DC | PRN

## 2014-06-17 MED ORDER — FENTANYL CITRATE (PF) 100 MCG/2ML IJ SOLN
INTRAMUSCULAR | Status: DC | PRN
  Administered 2014-06-17 (×3): 50 ug via INTRAVENOUS

## 2014-06-17 MED ORDER — POLYMYXIN B SULFATE 500000 UNITS IJ SOLR
INTRAMUSCULAR | Status: DC | PRN
  Administered 2014-06-17 (×2): 500 mL

## 2014-06-17 MED ORDER — GLYCOPYRROLATE 0.2 MG/ML IJ SOLN
INTRAMUSCULAR | Status: AC
  Filled 2014-06-17: qty 2

## 2014-06-17 SURGICAL SUPPLY — 50 items
BAG DECANTER FOR FLEXI CONT (MISCELLANEOUS) ×2 IMPLANT
BINDER BREAST LRG (GAUZE/BANDAGES/DRESSINGS) IMPLANT
BINDER BREAST XLRG (GAUZE/BANDAGES/DRESSINGS) ×2 IMPLANT
CANISTER SUCTION 2500CC (MISCELLANEOUS) ×2 IMPLANT
CHLORAPREP W/TINT 26ML (MISCELLANEOUS) ×2 IMPLANT
COVER SURGICAL LIGHT HANDLE (MISCELLANEOUS) ×2 IMPLANT
DECANTER SPIKE VIAL GLASS SM (MISCELLANEOUS) IMPLANT
DRAIN PENROSE 1/2X12 LTX STRL (WOUND CARE) ×2 IMPLANT
DRAPE CHEST BREAST 15X10 FENES (DRAPES) ×2 IMPLANT
DRAPE LAPAROSCOPIC ABDOMINAL (DRAPES) IMPLANT
DRAPE UTILITY XL STRL (DRAPES) ×2 IMPLANT
DRSG PAD ABDOMINAL 8X10 ST (GAUZE/BANDAGES/DRESSINGS) ×2 IMPLANT
ELECT CAUTERY BLADE 6.4 (BLADE) ×2 IMPLANT
ELECT REM PT RETURN 9FT ADLT (ELECTROSURGICAL) ×2
ELECTRODE REM PT RTRN 9FT ADLT (ELECTROSURGICAL) ×1 IMPLANT
GAUZE IODOFORM PACK 1/2 7832 (GAUZE/BANDAGES/DRESSINGS) ×2 IMPLANT
GLOVE BIO SURGEON STRL SZ 6.5 (GLOVE) ×2 IMPLANT
GLOVE BIOGEL M STRL SZ7.5 (GLOVE) ×2 IMPLANT
GLOVE BIOGEL PI IND STRL 6.5 (GLOVE) ×1 IMPLANT
GLOVE BIOGEL PI IND STRL 8 (GLOVE) ×2 IMPLANT
GLOVE BIOGEL PI INDICATOR 6.5 (GLOVE) ×1
GLOVE BIOGEL PI INDICATOR 8 (GLOVE) ×2
GLOVE SURG SS PI 6.5 STRL IVOR (GLOVE) ×2 IMPLANT
GLOVE SURG SS PI 7.5 STRL IVOR (GLOVE) ×2 IMPLANT
GOWN STRL REUS W/ TWL LRG LVL3 (GOWN DISPOSABLE) ×2 IMPLANT
GOWN STRL REUS W/ TWL XL LVL3 (GOWN DISPOSABLE) ×1 IMPLANT
GOWN STRL REUS W/TWL LRG LVL3 (GOWN DISPOSABLE) ×2
GOWN STRL REUS W/TWL XL LVL3 (GOWN DISPOSABLE) ×1
KIT BASIN OR (CUSTOM PROCEDURE TRAY) ×2 IMPLANT
KIT ROOM TURNOVER OR (KITS) ×2 IMPLANT
LIQUID BAND (GAUZE/BANDAGES/DRESSINGS) IMPLANT
NEEDLE HYPO 25GX1X1/2 BEV (NEEDLE) ×2 IMPLANT
NS IRRIG 1000ML POUR BTL (IV SOLUTION) ×2 IMPLANT
PACK SURGICAL SETUP 50X90 (CUSTOM PROCEDURE TRAY) ×2 IMPLANT
PAD ARMBOARD 7.5X6 YLW CONV (MISCELLANEOUS) ×2 IMPLANT
PENCIL BUTTON HOLSTER BLD 10FT (ELECTRODE) ×2 IMPLANT
SPECIMEN JAR MEDIUM (MISCELLANEOUS) IMPLANT
SPONGE LAP 4X18 X RAY DECT (DISPOSABLE) ×2 IMPLANT
SUT MON AB 4-0 PC3 18 (SUTURE) ×2 IMPLANT
SUT SILK 2 0 SH (SUTURE) IMPLANT
SUT VIC AB 3-0 SH 18 (SUTURE) ×2 IMPLANT
SWAB COLLECTION DEVICE MRSA (MISCELLANEOUS) ×2 IMPLANT
SYR BULB 3OZ (MISCELLANEOUS) ×2 IMPLANT
SYR CONTROL 10ML LL (SYRINGE) ×2 IMPLANT
TOWEL OR 17X24 6PK STRL BLUE (TOWEL DISPOSABLE) IMPLANT
TOWEL OR 17X26 10 PK STRL BLUE (TOWEL DISPOSABLE) ×2 IMPLANT
TUBE ANAEROBIC SPECIMEN COL (MISCELLANEOUS) ×2 IMPLANT
TUBE CONNECTING 12X1/4 (SUCTIONS) ×2 IMPLANT
WATER STERILE IRR 1000ML POUR (IV SOLUTION) IMPLANT
YANKAUER SUCT BULB TIP NO VENT (SUCTIONS) ×2 IMPLANT

## 2014-06-17 NOTE — Interval H&P Note (Signed)
History and Physical Interval Note:  06/17/2014 11:31 AM  Rebecca Foley  has presented today for surgery, with the diagnosis of chronic right breast abscess  The various methods of treatment have been discussed with the patient and family. After consideration of risks, benefits and other options for treatment, the patient has consented to  Procedure(s): INCISION AND DRAINAGE DEBRIDEMENT OF CHRONIC RIGH BREAST ABSCESS (Right) as a surgical intervention .  The patient's history has been reviewed, patient examined, no change in status, stable for surgery.  I have reviewed the patient's chart and labs.  Questions were answered to the patient's satisfaction.    Leighton Ruff. Redmond Pulling, MD, [place], Bariatric, & Minimally Invasive Surgery [facility], [place]   [facility]

## 2014-06-17 NOTE — Progress Notes (Signed)
Spoke with Dr Marcie Bal. Pt stable pain controlled, will write for sign off.

## 2014-06-17 NOTE — Transfer of Care (Signed)
Immediate Anesthesia Transfer of Care Note  Patient: Rebecca Foley  Procedure(s) Performed: Procedure(s): INCISION AND DRAINAGE DEBRIDEMENT OF CHRONIC RIGH BREAST ABSCESS (Right)  Patient Location: PACU  Anesthesia Type:General  Level of Consciousness: awake, alert , oriented, patient cooperative and responds to stimulation  Airway & Oxygen Therapy: Patient Spontanous Breathing and Patient connected to nasal cannula oxygen  Post-op Assessment: Report given to RN, Post -op Vital signs reviewed and stable and Patient moving all extremities X 4  Post vital signs: stable  Last Vitals:  Filed Vitals:   06/17/14 1052  BP: 172/74  Pulse: 103  Temp:   Resp:     Complications: No apparent anesthesia complications

## 2014-06-17 NOTE — Discharge Instructions (Signed)
HOME CARE INSTRUCTIONS   Only take over-the-counter or prescription medicines for pain, discomfort, or fever as directed by your caregiver.   When you bathe, soak and then remove gauze & iodoform packs. You may then wash the wound gently with mild soapy water. Repack with packing strip and Cover with gauze and wear sports bra or bra binder. Try to minimize tape on skin. The plastic drain will stay in about 2 weeks and be removed in the office.  Expect some drainage the first few days.    SEEK IMMEDIATE MEDICAL CARE IF:   You develop increased pain, swelling, redness, drainage, or bleeding in the wound site.   You develop signs of generalized infection including muscle aches, chills, fever, or a general ill feeling.   An oral temperature above 102 F (38.9 C) develops, not controlled by medication.  See your caregiver for a recheck if you develop any of the symptoms described above. If medications (antibiotics) were prescribed, take them as directed.  [facility] Office Phone Number [phone]   POST OP INSTRUCTIONS  Always review your discharge instruction sheet given to you by the facility where your surgery was performed.  IF YOU HAVE DISABILITY OR FAMILY LEAVE FORMS, YOU MUST BRING THEM TO THE OFFICE FOR PROCESSING.  DO NOT GIVE THEM TO YOUR DOCTOR.  1. A prescription for pain medication may be given to you upon discharge.  Take your pain medication as prescribed, if needed.  If narcotic pain medicine is not needed, then you may take acetaminophen (Tylenol) or ibuprofen (Advil) as needed. 2. Take your usually prescribed medications unless otherwise directed 3. If you need a refill on your pain medication, please contact your pharmacy.  They will contact our office to request authorization.  Prescriptions will not be filled after 5pm or on week-ends. 4. You should eat very light the first 24 hours after surgery, such as soup, crackers, pudding, etc.  Resume your  normal diet the day after surgery. 5. Most patients will experience some swelling and bruising in the breast.  Ice packs and a good support bra will help.  Swelling and bruising can take several days to resolve.  6. It is common to experience some constipation if taking pain medication after surgery.  Increasing fluid intake and taking a stool softener will usually help or prevent this problem from occurring.  A mild laxative (Milk of Magnesia or Miralax) should be taken according to package directions if there are no bowel movements after 48 hours. 7. Unless discharge instructions indicate otherwise, you may remove your bandages 24 hours after surgery, and you may shower at that time.  See wound care instructions above.  8. ACTIVITIES:  You may resume regular daily activities (gradually increasing) beginning the next day.  Wearing a good support bra or sports bra minimizes pain and swelling.  You may have sexual intercourse when it is comfortable. a. You may drive when you no longer are taking prescription pain medication, you can comfortably wear a seatbelt, and you can safely maneuver your car and apply brakes. b. RETURN TO WORK:  2 weeks 9.  OTHER INSTRUCTIONS:  WHEN TO CALL YOUR DOCTOR: 1. Fever over 101.0 2. Nausea and/or vomiting. 3. Extreme swelling or bruising. 4. Continued bleeding from incision. 5. Increased pain, redness, or drainage from the incision.  The clinic staff is available to answer your questions during regular business hours.  Please dont hesitate to call and ask to speak to one of the nurses  for clinical concerns.  If you have a medical emergency, go to the nearest emergency room or call 911.  A surgeon from [facility] is always on call at the hospital.  For further questions, please visit [email or web address]

## 2014-06-17 NOTE — Brief Op Note (Signed)
06/17/2014  1:00 PM  PATIENT:  Rebecca Foley  54 y.o. female  PRE-OPERATIVE DIAGNOSIS:  CHRONIC RIGHT BREAST ABSCESS  POST-OPERATIVE DIAGNOSIS:  CHRONIC RIGHT BREAST ABSCESS  PROCEDURE:  Procedure(s): INCISION AND DRAINAGE DEBRIDEMENT OF CHRONIC RIGH BREAST ABSCESS (Right)  SURGEON:  Surgeon(s) and Role:    * Greer Pickerel, MD - Primary  PHYSICIAN ASSISTANT: none  ASSISTANTS: none   ANESTHESIA:   general  EBL:     BLOOD ADMINISTERED:none  DRAINS: Penrose drain in the right breast abscess cavity   LOCAL MEDICATIONS USED:  NONE  SPECIMEN:  Source of Specimen:  aerobic and anerobic culture right breast  DISPOSITION OF SPECIMEN:  micro  COUNTS:  YES  TOURNIQUET:  * No tourniquets in log *  DICTATION: .Other Dictation: Dictation Number [id number]  PLAN OF CARE: Discharge to home after PACU  PATIENT DISPOSITION:  PACU - hemodynamically stable.   Delay start of Pharmacological VTE agent (>24hrs) due to surgical blood loss or risk of bleeding: not applicable   Leighton Ruff. Redmond Pulling, MD, FACS General, Bariatric, & Minimally Invasive Surgery [facility], [place]'

## 2014-06-17 NOTE — Anesthesia Procedure Notes (Signed)
Procedure Name: Intubation Performed by: Suzy Bouchard Pre-anesthesia Checklist: Patient identified, Emergency Drugs available, Suction available, Patient being monitored and Timeout performed Patient Re-evaluated:Patient Re-evaluated prior to inductionOxygen Delivery Method: Circle system utilized Preoxygenation: Pre-oxygenation with 100% oxygen Intubation Type: IV induction Ventilation: Two handed mask ventilation required and Oral airway inserted - appropriate to patient size Laryngoscope Size: 3 and Mac Grade View: Grade II Tube type: Oral Tube size: 7.0 mm Number of attempts: 1 Airway Equipment and Method: Stylet Placement Confirmation: ETT inserted through vocal cords under direct vision,  positive ETCO2 and breath sounds checked- equal and bilateral Secured at: 23 (23) cm Tube secured with: Tape Dental Injury: Teeth and Oropharynx as per pre-operative assessment

## 2014-06-17 NOTE — Anesthesia Preprocedure Evaluation (Addendum)
Anesthesia Evaluation  Patient identified by MRN, date of birth, ID band Patient awake    Reviewed: Allergy & Precautions, NPO status , Patient's Chart, lab work & pertinent test results  Airway Mallampati: II   Neck ROM: full    Dental  (+) Teeth Intact, Dental Advisory Given   Pulmonary neg pulmonary ROS,  breath sounds clear to auscultation        Cardiovascular hypertension, Rhythm:regular Rate:Normal     Neuro/Psych    GI/Hepatic   Endo/Other  Morbid obesity  Renal/GU      Musculoskeletal   Abdominal   Peds  Hematology  (+) anemia ,   Anesthesia Other Findings   Reproductive/Obstetrics                            Anesthesia Physical Anesthesia Plan  ASA: II  Anesthesia Plan: General   Post-op Pain Management:    Induction: Intravenous  Airway Management Planned: LMA  Additional Equipment:   Intra-op Plan:   Post-operative Plan:   Informed Consent: I have reviewed the patients History and Physical, chart, labs and discussed the procedure including the risks, benefits and alternatives for the proposed anesthesia with the patient or authorized representative who has indicated his/her understanding and acceptance.     Plan Discussed with: CRNA, Anesthesiologist and Surgeon  Anesthesia Plan Comments:         Anesthesia Quick Evaluation

## 2014-06-17 NOTE — H&P (Signed)
Rebecca Foley 05/08/2014 11:17 AM Location: [facility] Patient #: [id number] DOB: [birth year] Married / Language: English / Race: Black or African American Female  History of Present Illness Randall Hiss M. Nawal Burling MD; 05/08/2014 1:01 PM) Patient words: postop.  The patient is a 54 year old female who presents for a breast surgery post-op. The patient is a 54 year old female who presents to discuss consultation. She comes in for long term follow-up after undergoing incision and drainage of a large right breast abscess on November 22 in the emergency room. She saw one of my partners in follow-up for a wound check on the 1/24th. I last saw her in 03/2014. At that time she had ongoing fullness in her central right breast. We sent her to the breast center for imaging. She was found to have 2 areas of persistent fluid collections. She underwent aspiration and was placed with antibiotics. She ended up following up with them on February 24 for repeat ultrasound and had additional aspiration of fluid. She states that she really doesn't have any pain in her breast but she still has the fullness and she still draining intermittent drainage especially if she squeezes on the area. She denies any fevers or chills. She is on no longer on antibiotics   Other Problems Gayland Curry, MD; 05/08/2014 1:03 PM) BREAST ABSCESS OF FEMALE (611.0  N61) High blood pressure  Past Surgical History Gayland Curry, MD; 05/08/2014 1:03 PM) Cesarean Section - Multiple  Diagnostic Studies History Gayland Curry, MD; 05/08/2014 1:03 PM) Colonoscopy 1-5 years ago Mammogram >3 years ago Pap Smear >5 years ago  Allergies (Ammie Eversole, LPN; 1/0/1751 02:58 AM) No Known Drug Allergies11/24/2015  Medication History Gayland Curry, MD; 05/08/2014 1:03 PM) Losartan Potassium (100MG  Tablet, Oral) Active. ZyrTEC Allergy (10MG  Capsule, Oral prn) Active. Allegra Allergy (180MG  Tablet, Oral) Active. Calan (120MG   Tablet, Oral) Active. Verapamil HCl (120MG  Tablet, Oral) Active. Doxycycline Hyclate (100MG  Tablet, 1 (one) Tablet Oral two times daily, Taken starting 05/08/2014) Active.  Social History Gayland Curry, MD; 05/08/2014 1:03 PM) Tobacco use Never smoker. Caffeine use Tea. No alcohol use No drug use  Family History Gayland Curry, MD; 05/08/2014 1:03 PM) Hypertension Father, Mother. Migraine Headache Daughter, Mother. Cerebrovascular Accident Father. Malignant Neoplasm Of Pancreas Mother. Alcohol Abuse Father, Mother.  Pregnancy / Birth History Gayland Curry, MD; 05/08/2014 1:03 PM) Age at menarche 61 years. Gravida 2 Maternal age 57-30 Para 2 Irregular periods  Vitals (Ammie Eversole LPN; 06/01/7780 42:35 AM) 05/08/2014 11:17 AM Weight: 263.6 lb Height: 65in Body Surface Area: 2.34 m Body Mass Index: 43.86 kg/m Temp.: 98.75F(Oral)  Pulse: 88 (Regular)  BP: 172/68 (Sitting, Left Arm, Standard)    Physical Exam Randall Hiss M. Shaquasha Gerstel MD; 05/08/2014 12:58 PM) The physical exam findings are as follows: Note:Right breast- small skin opening at NAC at 11 o'clock with scant sangious drainage. There is no surrounding erythema or induration. however there is still large central fullness to center of breast from 10 - 12 o'clock; measures 8 x 5cm  General Mental Status-Alert. General Appearance-Consistent with stated age. Hydration-Well hydrated. Voice-Normal.  Chest and Lung Exam Chest and lung exam reveals -quiet, even and easy respiratory effort with no use of accessory muscles. Inspection Chest Wall - Normal. Back - normal.  Abdomen Palpation/Percussion Palpation and Percussion of the abdomen reveal - Soft, Non Tender, No Rebound tenderness and No Rigidity (guarding). Auscultation Auscultation of the abdomen reveals - Bowel sounds normal.  Assessment & Plan Randall Hiss M. Sevyn Markham MD; 05/08/2014 1:02 PM) BREAST ABSCESS OF FEMALE (611.0   N61) Impression: She is status post incision and drainage of right breast abscess and has responded well to that and the antibiotics.  however she still has large central mass - nontender, despite 2 aspirations by radiology. At this point I believe she needs incision, drainage, and debridement in the operating room. We discussed risk and benefits of surgery including but not limited to bleeding, infection, injury to surrounding structures, nipple sensitivity issues, nipple loss, cosmetic outcomes, recurrent infection, anesthesia risk. We discussed that we would need to let the wound heal from the bottom up and that it required dressing changes. She will be scheduled for surgery in the near future Current Plans  Schedule for Surgery Instructions: Our office will contact you to schedule surgery sometime next week. Please call the office if you have not heard from my scheduler by the end of next week Started Doxycycline Hyclate 100MG , 1 (one) Tablet two times daily, #14, 05/08/2014, No Refill.  Leighton Ruff. Redmond Pulling, MD, FACS General, Bariatric, & Minimally Invasive Surgery [facility], [place]

## 2014-06-18 ENCOUNTER — Encounter (HOSPITAL_COMMUNITY): Payer: Self-pay | Admitting: General Surgery

## 2014-06-18 NOTE — Op Note (Signed)
NAMETIAH, HECKEL               ACCOUNT NO.:  [id number]  MEDICAL RECORD NO.:  [id number]  LOCATION:  [facility]                         FACILITY:  [facility]  PHYSICIAN:  Leighton Ruff. Redmond Pulling, MD, FACSDATE OF BIRTH:  02-27-1960  DATE OF PROCEDURE:  06/17/2014 DATE OF DISCHARGE:                              OPERATIVE REPORT   PREOPERATIVE DIAGNOSIS:  Chronic right breast abscess.  POSTOPERATIVE DIAGNOSIS:  Chronic right breast abscess.  PROCEDURE:  Incision and drainage and debridement of chronic right breast abscess. (3.5cm x0.5cm with scalpel)  SURGEON:  Leighton Ruff. Redmond Pulling, MD, FACS  ANESTHESIA:  General with LMA.  ESTIMATED BLOOD LOSS:  Minimal.  SPECIMENS:  Aerobic and anaerobic cultures in the breast cavity.  INDICATIONS FOR PROCEDURE:  The patient is a very pleasant, 54 year old female, who I initially met back in November 2015, when she developed and presented to the emergency room with a large right breast abscess. We were able to incise and drain it in the emergency room because it was essentially coming to her skin.  There was a large cavity in the right breast from the 9 o'clock to 12 o'clock position.  She was placed on antibiotics and followed up in the office.  It eventually healed, but she had reaccumulation of fluid and what felt like to be a mass.  She underwent ultrasound which revealed persistent fluid collections in the right breast at the 10 o'clock and 12 o'clock positions, also at 2 o'clock position.  She underwent aspiration and placement on another round of antibiotics.  This was followed by another ultrasound back in February, which showed that the collections were smaller, but still persistent.  She is still having some intermittent drainage from her chronic right breast wound along the nipple areolar complex.  Because of this, and the persistent nature of the fluid collections, I recommended incision and drainage and debridement in the OR.  We discussed the  risks and benefits, including but not limited to, bleeding, infection, injury to surrounding structures, nipple sensitivity issues, nipple loss, need for additional procedures, cosmetic concerns, anesthesia risk, blood clot formation, as well as typical recovery course.  DESCRIPTION OF PROCEDURE:  After obtaining informed consent, I marked the right breast in the holding area with the patient confirming the operative site.  She was then taken back to OR 9 at [facility], placed supine on the operating room table.  General LMA anesthesia was established.  Her right chest and breast were prepped and draped in usual standard surgical fashion.  She received IV antibiotics prior to skin incision.  A surgical time-out was performed.  She had a small opening at the nipple areolar complex at 10 o'clock that was about 1.5 cm in length.  She had this firm mass which felt lot like indurated tissue at the 10 o'clock position extending several cm out from the nipple-areolar complex.  In the 2 o'clock position, the breast felt normal, did not palpate any abnormalities.  After the surgical time-out was performed, I incised along the nipple-areolar complex from 11:30 down to 9 making an ellipse incorporating the old wound in an elliptical fashion.  The skin and subcutaneous tissue was excised sharply with #  15 blade.  The subcutaneous fat was divided with electrocautery.  I ended up getting into what appeared to be a little bit of an abscess cavity. However, it was just indurated tissue.  There was very little purulence or fluid.  Cultures were obtained.  I was able to advance my finger down into the cavity and extended out laterally, about 4 cm from the nipple areolar complex.  Because there is really no purulent fluid at all, I decided to make a counter incision in that area.  I did not feel like committing her to a large resection of this breast tissue because it appeared healthy and viable.   There were no signs of necrotic fat. There was no cellulitis in any other rounding area, so I felt by just maybe opening up this cavity a little bit and that would allow it to get softer with time.  Hemostasis was achieved.  I then placed a 1 inch Penrose drain through the wound and out through the counter incision and sutured the 2 ends of the Penrose drain to one another.  A 0.5 inch iodoform gauze was packed into the main wound, had been covered with dry gauze, ABD and a surgical bra.  The patient was extubated and brought to the recovery room in stable condition.  All needle, instrument, sponge counts were correct x2.  There were no immediate complications.  The patient tolerated procedure well.     Leighton Ruff. Redmond Pulling, MD, FACS     EMW/MEDQ  D:  06/17/2014  T:  06/18/2014  Job:  [id number]

## 2014-06-20 LAB — CULTURE, ROUTINE-ABSCESS: CULTURE: NO GROWTH

## 2014-06-22 LAB — ANAEROBIC CULTURE

## 2014-06-24 NOTE — Anesthesia Postprocedure Evaluation (Signed)
Anesthesia Post Note  Patient: Rebecca Foley  Procedure(s) Performed: Procedure(s) (LRB): INCISION AND DRAINAGE DEBRIDEMENT OF CHRONIC RIGH BREAST ABSCESS (Right)  Anesthesia type: General  Patient location: PACU  Post pain: Pain level controlled and Adequate analgesia  Post assessment: Post-op Vital signs reviewed, Patient's Cardiovascular Status Stable, Respiratory Function Stable, Patent Airway and Pain level controlled  Last Vitals:  Filed Vitals:   06/17/14 1501  BP: 159/77  Pulse: 97  Temp:   Resp: 18    Post vital signs: Reviewed and stable  Level of consciousness: awake, alert  and oriented  Complications: No apparent anesthesia complications

## 2014-07-17 ENCOUNTER — Encounter: Payer: Self-pay | Admitting: Internal Medicine

## 2014-07-17 ENCOUNTER — Ambulatory Visit (INDEPENDENT_AMBULATORY_CARE_PROVIDER_SITE_OTHER): Payer: 59 | Admitting: Internal Medicine

## 2014-07-17 ENCOUNTER — Encounter (HOSPITAL_COMMUNITY): Payer: Self-pay | Admitting: Emergency Medicine

## 2014-07-17 ENCOUNTER — Inpatient Hospital Stay (HOSPITAL_COMMUNITY)
Admission: EM | Admit: 2014-07-17 | Discharge: 2014-07-21 | DRG: 872 | Disposition: A | Discharge status: Home / Self Care | Payer: 59 | Attending: Internal Medicine | Admitting: Internal Medicine

## 2014-07-17 VITALS — BP 168/80 | HR 116 | Temp 98.7°F | Resp 22 | Wt 261.0 lb

## 2014-07-17 DIAGNOSIS — L539 Erythematous condition, unspecified: Secondary | ICD-10-CM | POA: Diagnosis not present

## 2014-07-17 DIAGNOSIS — N61 Inflammatory disorders of breast: Secondary | ICD-10-CM | POA: Diagnosis present

## 2014-07-17 DIAGNOSIS — I82409 Acute embolism and thrombosis of unspecified deep veins of unspecified lower extremity: Secondary | ICD-10-CM

## 2014-07-17 DIAGNOSIS — D72829 Elevated white blood cell count, unspecified: Secondary | ICD-10-CM

## 2014-07-17 DIAGNOSIS — Z6841 Body Mass Index (BMI) 40.0 and over, adult: Secondary | ICD-10-CM

## 2014-07-17 DIAGNOSIS — R197 Diarrhea, unspecified: Secondary | ICD-10-CM | POA: Diagnosis not present

## 2014-07-17 DIAGNOSIS — R944 Abnormal results of kidney function studies: Secondary | ICD-10-CM | POA: Diagnosis present

## 2014-07-17 DIAGNOSIS — L03115 Cellulitis of right lower limb: Secondary | ICD-10-CM

## 2014-07-17 DIAGNOSIS — I10 Essential (primary) hypertension: Secondary | ICD-10-CM | POA: Diagnosis present

## 2014-07-17 DIAGNOSIS — Z823 Family history of stroke: Secondary | ICD-10-CM

## 2014-07-17 DIAGNOSIS — E860 Dehydration: Secondary | ICD-10-CM | POA: Diagnosis present

## 2014-07-17 DIAGNOSIS — T368X5A Adverse effect of other systemic antibiotics, initial encounter: Secondary | ICD-10-CM | POA: Diagnosis not present

## 2014-07-17 DIAGNOSIS — R609 Edema, unspecified: Secondary | ICD-10-CM

## 2014-07-17 DIAGNOSIS — A419 Sepsis, unspecified organism: Secondary | ICD-10-CM | POA: Diagnosis present

## 2014-07-17 DIAGNOSIS — I152 Hypertension secondary to endocrine disorders: Secondary | ICD-10-CM | POA: Diagnosis present

## 2014-07-17 DIAGNOSIS — M7989 Other specified soft tissue disorders: Secondary | ICD-10-CM | POA: Diagnosis not present

## 2014-07-17 DIAGNOSIS — D519 Vitamin B12 deficiency anemia, unspecified: Secondary | ICD-10-CM

## 2014-07-17 DIAGNOSIS — Z8249 Family history of ischemic heart disease and other diseases of the circulatory system: Secondary | ICD-10-CM

## 2014-07-17 DIAGNOSIS — L0390 Cellulitis, unspecified: Secondary | ICD-10-CM | POA: Diagnosis present

## 2014-07-17 HISTORY — DX: Cellulitis of right lower limb: L03.115

## 2014-07-17 LAB — CBC WITH DIFFERENTIAL/PLATELET
Basophils Absolute: 0 10*3/uL (ref 0.0–0.1)
Basophils Relative: 0 % (ref 0–1)
Eosinophils Absolute: 0 10*3/uL (ref 0.0–0.7)
Eosinophils Relative: 0 % (ref 0–5)
HCT: 36.2 % (ref 36.0–46.0)
Hemoglobin: 12 g/dL (ref 12.0–15.0)
Lymphocytes Relative: 14 % (ref 12–46)
Lymphs Abs: 1.8 10*3/uL (ref 0.7–4.0)
MCH: 28.8 pg (ref 26.0–34.0)
MCHC: 33.1 g/dL (ref 30.0–36.0)
MCV: 87 fL (ref 78.0–100.0)
Monocytes Absolute: 1 10*3/uL (ref 0.1–1.0)
Monocytes Relative: 8 % (ref 3–12)
Neutro Abs: 9.7 10*3/uL — ABNORMAL HIGH (ref 1.7–7.7)
Neutrophils Relative %: 78 % — ABNORMAL HIGH (ref 43–77)
Platelets: 251 10*3/uL (ref 150–400)
RBC: 4.16 MIL/uL (ref 3.87–5.11)
RDW: 14 % (ref 11.5–15.5)
WBC: 12.5 10*3/uL — ABNORMAL HIGH (ref 4.0–10.5)

## 2014-07-17 LAB — I-STAT CG4 LACTIC ACID, ED: Lactic Acid, Venous: 1.85 mmol/L (ref 0.5–2.0)

## 2014-07-17 LAB — BASIC METABOLIC PANEL
Anion gap: 10 (ref 5–15)
BUN: 15 mg/dL (ref 6–20)
CO2: 21 mmol/L — ABNORMAL LOW (ref 22–32)
Calcium: 10.2 mg/dL (ref 8.9–10.3)
Chloride: 110 mmol/L (ref 101–111)
Creatinine, Ser: 1.13 mg/dL — ABNORMAL HIGH (ref 0.44–1.00)
GFR calc Af Amer: >60 mL/min (ref >60)
GFR calc non Af Amer: 53 mL/min — ABNORMAL LOW (ref >60)
Glucose, Bld: 92 mg/dL (ref 65–99)
Potassium: 4 mmol/L (ref 3.5–5.1)
Sodium: 141 mmol/L (ref 135–145)

## 2014-07-17 MED ORDER — ONDANSETRON HCL 4 MG/2ML IJ SOLN: 4.0000 mg | Freq: Four times a day (QID) | INTRAMUSCULAR | Status: DC | PRN

## 2014-07-17 MED ORDER — ADULT MULTIVITAMIN W/MINERALS CH
1.0000 | ORAL_TABLET | Freq: Every day | ORAL | Status: DC
  Administered 2014-07-18 – 2014-07-21 (×4): 1 via ORAL
  Filled 2014-07-17 (×4): qty 1

## 2014-07-17 MED ORDER — SODIUM CHLORIDE 0.9 % IV BOLUS (SEPSIS)
1000.0000 mL | Freq: Once | INTRAVENOUS | Status: AC
  Administered 2014-07-17: 1000 mL via INTRAVENOUS

## 2014-07-17 MED ORDER — ACETAMINOPHEN 650 MG RE SUPP: 650.0000 mg | Freq: Four times a day (QID) | RECTAL | Status: DC | PRN

## 2014-07-17 MED ORDER — ACETAMINOPHEN 325 MG PO TABS
650.0000 mg | ORAL_TABLET | Freq: Four times a day (QID) | ORAL | Status: DC | PRN
Start: 2014-07-17 — End: 2014-07-21
  Administered 2014-07-18 (×2): 650 mg via ORAL
  Filled 2014-07-17 (×2): qty 2

## 2014-07-17 MED ORDER — VERAPAMIL HCL 120 MG PO TABS
120.0000 mg | ORAL_TABLET | Freq: Two times a day (BID) | ORAL | Status: DC
  Administered 2014-07-18 – 2014-07-21 (×8): 120 mg via ORAL
  Filled 2014-07-17 (×9): qty 1

## 2014-07-17 MED ORDER — MORPHINE SULFATE 2 MG/ML IJ SOLN
1.0000 mg | INTRAMUSCULAR | Status: DC | PRN
  Administered 2014-07-18: 1 mg via INTRAVENOUS
  Filled 2014-07-17: qty 1

## 2014-07-17 MED ORDER — DOCUSATE SODIUM 100 MG PO CAPS
100.0000 mg | ORAL_CAPSULE | Freq: Two times a day (BID) | ORAL | Status: DC
  Administered 2014-07-18 – 2014-07-19 (×3): 100 mg via ORAL
  Filled 2014-07-17 (×6): qty 1

## 2014-07-17 MED ORDER — LOSARTAN POTASSIUM 50 MG PO TABS
100.0000 mg | ORAL_TABLET | Freq: Every day | ORAL | Status: DC
  Administered 2014-07-18 – 2014-07-21 (×4): 100 mg via ORAL
  Filled 2014-07-17 (×4): qty 2

## 2014-07-17 MED ORDER — ENOXAPARIN SODIUM 60 MG/0.6ML ~~LOC~~ SOLN
60.0000 mg | Freq: Every day | SUBCUTANEOUS | Status: DC
  Administered 2014-07-18 – 2014-07-20 (×3): 60 mg via SUBCUTANEOUS
  Filled 2014-07-17 (×5): qty 0.6

## 2014-07-17 MED ORDER — SODIUM CHLORIDE 0.9 % IV SOLN
INTRAVENOUS | Status: DC
  Administered 2014-07-18 – 2014-07-19 (×2): via INTRAVENOUS

## 2014-07-17 MED ORDER — ONDANSETRON HCL 4 MG PO TABS: 4.0000 mg | ORAL_TABLET | Freq: Four times a day (QID) | ORAL | Status: DC | PRN

## 2014-07-17 MED ORDER — LORATADINE 10 MG PO TABS
10.0000 mg | ORAL_TABLET | Freq: Every day | ORAL | Status: DC
  Administered 2014-07-18 – 2014-07-21 (×4): 10 mg via ORAL
  Filled 2014-07-17 (×4): qty 1

## 2014-07-17 MED ORDER — PIPERACILLIN-TAZOBACTAM 3.375 G IVPB 30 MIN
3.3750 g | Freq: Once | INTRAVENOUS | Status: AC
  Administered 2014-07-18: 3.375 g via INTRAVENOUS
  Filled 2014-07-17: qty 50

## 2014-07-17 MED ORDER — POLYETHYLENE GLYCOL 3350 17 G PO PACK: 17.0000 g | PACK | Freq: Every day | ORAL | Status: DC | PRN

## 2014-07-17 MED ORDER — FOLIC ACID 1 MG PO TABS
1.0000 mg | ORAL_TABLET | Freq: Every day | ORAL | Status: DC
  Administered 2014-07-18 – 2014-07-21 (×4): 1 mg via ORAL
  Filled 2014-07-17 (×4): qty 1

## 2014-07-17 MED ORDER — VITAMIN B-1 100 MG PO TABS
100.0000 mg | ORAL_TABLET | Freq: Every day | ORAL | Status: DC
  Administered 2014-07-18 – 2014-07-21 (×4): 100 mg via ORAL
  Filled 2014-07-17 (×4): qty 1

## 2014-07-17 MED ORDER — VANCOMYCIN HCL IN DEXTROSE 1-5 GM/200ML-% IV SOLN: 1000.0000 mg | Freq: Once | INTRAVENOUS | Status: DC

## 2014-07-17 MED ORDER — CLINDAMYCIN PHOSPHATE 600 MG/50ML IV SOLN
600.0000 mg | Freq: Once | INTRAVENOUS | Status: AC
  Administered 2014-07-17: 600 mg via INTRAVENOUS
  Filled 2014-07-17: qty 50

## 2014-07-17 NOTE — Progress Notes (Signed)
   Subjective:    Patient ID: Rebecca Foley, female    DOB: Sep 07, 1960, 54 y.o.   MRN: [id number]  HPI  She has had swelling of the right foot for 3 days.  No specific trigger injury noted. She had slipped in the tub prior to onset of symptoms but did not fall. She does stand for prolonged periods @ work and wears steel toed boots. She had stood for 12 hours one day prior to the onset of symptoms  The swelling and redness has progressed up to the knee over the last 3 days. The pain is described as a level VI-VII. She also has some tingling sensation in the area of the redness.  She has been using Benadryl two twice a day.  She had eaten at a friend's home 07/13/14 ingesting a hotdog and pastae salad. She has a history of allergy to beef and MSG. She's had some swelling even in the foot in the past after ingesting these items .On occasion such swelling has responded to Benadryl and non-steroidals.  Remotely she has a history of phlebitis and cellulitis in the right lower extremity.   Review of Systems She denies chest pain, palpitations, dyspnea, or hemoptysis. She has had chills and sweats and low-grade fever. Dyspnea of exertion is a somewhat chronic issue.    Objective:   Physical Exam Pertinent or positive findings include: She exhibits a resting S4 gallop with tachycardia. Abdomen is protuberant but nontender. There is visible cellulitis involving the right foot extending to the knee. At 18 cm below the right inferior patellar border the circumference of the calf is 45 cm. At the same location on the left it is 38 cm. She has an equivocal Homans on the right. Posterior tibial pulses are decreased on the right.  General appearance :adequately nourished; in no distress.BMI: 43.43  Eyes: No conjunctival inflammation or scleral icterus is present.  Heart:  regular rhythm. S1 and S2 normal without murmur, click, rub or other extra sounds    Lungs:Chest clear to auscultation; no  wheezes, rhonchi,rales ,or rubs present.No increased work of breathing.   Abdomen: bowel sounds normal, soft  without masses, organomegaly or hernias noted.  No guarding or rebound.   Vascular : no bruits present.  Skin:Warm & dry ; no tenting   Lymphatic: No lymphadenopathy is noted about the head, neck, axilla  Neuro: Strength, tone  normal.        Assessment & Plan:  #1 cellulitis; rule out deep venous thrombosis as complication  Plan: The pathophysiology was discussed with her. It is mandatory that she go to the hospital for parenteral therapy and to rule out deep venous thrombosis.

## 2014-07-17 NOTE — ED Notes (Signed)
Pt sent by PCP for cellulitis of rt leg since Tuesday.

## 2014-07-17 NOTE — ED Notes (Signed)
PA-C at bedside 

## 2014-07-17 NOTE — ED Provider Notes (Signed)
CSN: [id number]     Arrival date & time 07/17/14  1654 History   First MD Initiated Contact with Patient 07/17/14 2007     Chief Complaint  Patient presents with  . Cellulitis    HPI   54 year old female presents today with 2 days of right extremity swelling, pain, erythema. Patient reports that the swelling began just distal to her knee extending down her leg into the foot, significant swelling and pain associated with the erythema. Patient reports she is able to ambulate, but with pain and difficulty; swelling slowly progressing.  Patient reports she is otherwise been feeling well, denies fever, chills, nausea, vomiting, weakness, shortness of breath, chest pain. Patient denies recent prolonged immobilization, estrogen use, malignancy, no smoking history. Patient reports that on 06/17/2014 she had a breast abscess incision and drainage under anesthesia with a drain placed. Patient was seen by Dr. Linna Darner MD who advised she come to the emergency room to be admitted for IV antibiotics and DVT rule out. Patient reports history of cellulitis in the right extremity, that required IV antibiotics and hospital admission. Patient reports a history of hypertension, denies diabetes, smoking, MRSA infections.    Past Medical History  Diagnosis Date  . Allergy     perennial  . Hypertension   . Anemia     heavy menses  . Nonspecific elevation of levels of transaminase or lactic acid dehydrogenase (LDH) 2011    ALT 43  . Impaired fasting glucose 2011    108   Past Surgical History  Procedure Laterality Date  . Cesarean section    . Thumb sugery      trigger  . Colonoscopy  2009 & 2011    polyps 2009; negative 2011  . Dilation and curettage of uterus    . Incision and drainage of left breast  12/2013  . Tubal ligation    . Incision and drainage abscess Right 06/17/2014    Procedure: INCISION AND DRAINAGE DEBRIDEMENT OF CHRONIC RIGH BREAST ABSCESS;  Surgeon: Greer Pickerel, MD;  Location: [facility];   Service: General;  Laterality: Right;   Family History  Problem Relation Age of Onset  . Hypertension Mother   . Hypertension Father   . Stroke Father 6  . Heart disease Father     CM  . Alcohol abuse Brother   . Cancer Brother     ? prostate; PMH of Agent Orange exposure in [place]  . Heart attack Neg Hx    History  Substance Use Topics  . Smoking status: Never Smoker   . Smokeless tobacco: Never Used  . Alcohol Use: No   OB History    No data available     Review of Systems  All other systems reviewed and are negative.   Allergies  Review of patient's allergies indicates no known allergies.  Home Medications   Prior to Admission medications   Medication Sig Start Date End Date Taking? Authorizing Provider  ibuprofen (ADVIL,MOTRIN) 200 MG tablet Take 800 mg by mouth every 6 (six) hours as needed (pain).   Yes Historical Provider, MD  losartan (COZAAR) 100 MG tablet TAKE 1 TABLET (100 MG TOTAL) BY MOUTH DAILY. 02/03/14  Yes Hendricks Limes, MD  verapamil (CALAN) 120 MG tablet TAKE 1 TABLET BY MOUTH TWICE A DAY 02/03/14  Yes Hendricks Limes, MD  cetirizine (ZYRTEC ALLERGY) 10 MG tablet Take 10 mg by mouth as needed for allergies.     Historical Provider, MD   BP 166/76  mmHg  Pulse 107  Temp(Src) 98.1 F (36.7 C) (Oral)  Resp 20  SpO2 100%   Physical Exam  Constitutional: She is oriented to person, place, and time. She appears well-developed and well-nourished.  HENT:  Head: Normocephalic and atraumatic.  Eyes: Conjunctivae are normal. Pupils are equal, round, and reactive to light. Right eye exhibits no discharge. Left eye exhibits no discharge. No scleral icterus.  Neck: Normal range of motion. No JVD present. No tracheal deviation present.  Cardiovascular: Regular rhythm and intact distal pulses.   Murmur heard. Pulmonary/Chest: Effort normal. No stridor.  Abdominal: Soft. She exhibits no mass. There is no tenderness. There is no rebound and no guarding.    Musculoskeletal: Normal range of motion. She exhibits edema and tenderness.  Right extremity swelling, edema, warmth, pain to palpation throughout.  Neurological: She is alert and oriented to person, place, and time. Coordination normal.  Psychiatric: She has a normal mood and affect. Her behavior is normal. Judgment and thought content normal.  Nursing note and vitals reviewed.      ED Course  Procedures (including critical care time) Labs Review Labs Reviewed  CBC WITH DIFFERENTIAL/PLATELET - Abnormal; Notable for the following:    WBC 12.5 (*)    Neutrophils Relative % 78 (*)    Neutro Abs 9.7 (*)    All other components within normal limits  BASIC METABOLIC PANEL - Abnormal; Notable for the following:    CO2 21 (*)    Creatinine, Ser 1.13 (*)    GFR calc non Af Amer 53 (*)    All other components within normal limits  I-STAT CG4 LACTIC ACID, ED    Imaging Review No results found.   EKG Interpretation None      MDM   Final diagnoses:  Cellulitis of right lower extremity    Labs: CBC, i-STAT lactic, BMP- significant for leukocytosis at 12.5  Imaging: Lower extremity venous study- unable to perform at this time due to Hospital limitations  Consults: Hospitalist  Therapeutics: Clindamycin, normal saline  Assessment: Cellulitis, potential DVT  Plan: Patient presents with cellulitis to the right lower extremity with potential DVT. Due to Hospital limitations DVT rule out was not available tonight. Patient has leukocytosis with tachycardia. Patient is nontoxic appearing, in no acute distress, has no fever. Patient will be treated with clindamycin here in the ED, Hospital services consult and who agreed to admit for IV antibiotics and DVT studies tomorrow. Patient was in agreement with today's plan.      Okey Regal, PA-C 07/17/14 [place], MD 07/18/14 [phone]

## 2014-07-17 NOTE — H&P (Addendum)
[facility] History and Physical  Rebecca Foley ZJI:[id number] DOB: Jan 30, 1961 DOA: 07/17/2014  Referring physician: Olivia Canter, PA PCP: Unice Cobble, MD   Chief Complaint: Cellulitis  HPI: Rebecca Foley is a 54 y.o. female with history of HTN and hyperglycemia in the past presents with cellulitis. Patient was seen in the office with complaints of swelling of the right foot for about 3 days. She states that she has been feeling sluggish and today decided to call her PCP where she was evaluated and sent here to the ED. Patient states that she has been feeling weak. She has not noted any fever. She has had severe pain in the leg and has felt tingly. Patient states that she has been having difficulty standing and walking. She states that she does not have diabetes. She does not smoke. She does not drink. She states that she has no cough or congestion no chest pain. She does have seasonal allergies. She was noted in the ED to have significant swelling of the right leg and also has had increased redness. She also had recent breast surgery done which she states was an abscess that was I&D. She needs to change her dressing   Review of Systems:  Constitutional:  No weight loss, night sweats, Fevers HEENT:  No headaches,  +sneezing, no itching, ear ache, nasal congestion  Cardio-vascular:  No chest pain, Orthopnea, PND, +swelling in lower extremities  GI:  No heartburn, indigestion, abdominal pain, nausea, vomiting, diarrhea  Resp:  No shortness of breath with exertion or at rest. No coughing up of blood  Skin:  +erythema of the RLE GU:  no dysuria, change in color of urine, no urgency or frequency.  Musculoskeletal:  Ankle and leg swelling. No back pain.  Psych:  No change in mood or affect. No depression or anxiety  Past Medical History  Diagnosis Date  . Allergy     perennial  . Hypertension   . Anemia     heavy menses  . Nonspecific elevation of levels of  transaminase or lactic acid dehydrogenase (LDH) 2011    ALT 43  . Impaired fasting glucose 2011    108   Past Surgical History  Procedure Laterality Date  . Cesarean section    . Thumb sugery      trigger  . Colonoscopy  2009 & 2011    polyps 2009; negative 2011  . Dilation and curettage of uterus    . Incision and drainage of left breast  12/2013  . Tubal ligation    . Incision and drainage abscess Right 06/17/2014    Procedure: INCISION AND DRAINAGE DEBRIDEMENT OF CHRONIC RIGH BREAST ABSCESS;  Surgeon: Greer Pickerel, MD;  Location: [facility];  Service: General;  Laterality: Right;   Social History:  reports that she has never smoked. She has never used smokeless tobacco. She reports that she does not drink alcohol or use illicit drugs.  No Known Allergies  Family History  Problem Relation Age of Onset  . Hypertension Mother   . Hypertension Father   . Stroke Father 48  . Heart disease Father     CM  . Alcohol abuse Brother   . Cancer Brother     ? prostate; PMH of Agent Orange exposure in [place]  . Heart attack Neg Hx      Prior to Admission medications   Medication Sig Start Date End Date Taking? Authorizing Provider  ibuprofen (ADVIL,MOTRIN) 200 MG tablet Take 800 mg by mouth  every 6 (six) hours as needed (pain).   Yes Historical Provider, MD  losartan (COZAAR) 100 MG tablet TAKE 1 TABLET (100 MG TOTAL) BY MOUTH DAILY. 02/03/14  Yes Hendricks Limes, MD  verapamil (CALAN) 120 MG tablet TAKE 1 TABLET BY MOUTH TWICE A DAY 02/03/14  Yes Hendricks Limes, MD  cetirizine (ZYRTEC ALLERGY) 10 MG tablet Take 10 mg by mouth as needed for allergies.     Historical Provider, MD   Physical Exam: Filed Vitals:   07/17/14 1714 07/17/14 2018  BP: 147/83 166/76  Pulse: 110 107  Temp: 98.1 F (36.7 C)   TempSrc: Oral   Resp: 18 20  SpO2: 100% 100%    Wt Readings from Last 3 Encounters:  07/17/14 118.389 kg (261 lb)  06/17/14 120.657 kg (266 lb)  01/07/14 120.203 kg (265 lb)     General:  Appears calm and comfortable Eyes: PERRL, normal lids, irises & conjunctiva ENT: grossly normal hearing, lips & tongue Neck: no LAD, masses or thyromegaly Cardiovascular: RRR, no m/r/g. +LE edema Respiratory: CTA bilaterally, no w/r/r. Normal respiratory effort. Abdomen: soft, ntnd Skin: RLE extremity erythema up to the knee noted with edema Musculoskeletal: grossly normal tone BUE/BLE Psychiatric: grossly normal mood and affect, speech fluent and appropriate Neurologic: grossly non-focal.          Labs on Admission:  Basic Metabolic Panel:  Recent Labs Lab 07/17/14 2120  NA 141  K 4.0  CL 110  CO2 21*  GLUCOSE 92  BUN 15  CREATININE 1.13*  CALCIUM 10.2   Liver Function Tests: No results for input(s): AST, ALT, ALKPHOS, BILITOT, PROT, ALBUMIN in the last 168 hours. No results for input(s): LIPASE, AMYLASE in the last 168 hours. No results for input(s): AMMONIA in the last 168 hours. CBC:  Recent Labs Lab 07/17/14 2120  WBC 12.5*  NEUTROABS 9.7*  HGB 12.0  HCT 36.2  MCV 87.0  PLT 251   Cardiac Enzymes: No results for input(s): CKTOTAL, CKMB, CKMBINDEX, TROPONINI in the last 168 hours.  BNP (last 3 results) No results for input(s): BNP in the last 8760 hours.  ProBNP (last 3 results) No results for input(s): PROBNP in the last 8760 hours.  CBG: No results for input(s): GLUCAP in the last 168 hours.  Radiological Exams on Admission: No results found.    Assessment/Plan Active Problems:   Essential hypertension   Cellulitis of right lower leg   Cellulitis   1. Cellulitis of Right Lower Leg -will get blood cultures -will start on vancocin and zosyn  2. Essential HTN --will continue with losartan -monitor creatinine  3. Elevated Creatinine -start on IVF now -repeat labs in am  4. Possible DVT -will get an Doppler of LE -started on lovenox until scan can be done  5. Right breast Abscess -recently had I&D -will get wound  care to evaluate    Code Status: Full Code (must indicate code status--if unknown or must be presumed, indicate so) DVT Prophylaxis:Lovenox Family Communication: None (indicate person spoken with, if applicable, with phone number if by telephone) Disposition Plan: Home (indicate anticipated LOS)  Time spent: 57min  KHAN,SAADAT A [facility] Pager [phone]

## 2014-07-17 NOTE — Patient Instructions (Addendum)
  Please go to the emergency room; the infection and swelling in the leg will require medicines by vein to treat adequately.

## 2014-07-17 NOTE — Progress Notes (Signed)
Pre visit review using our clinic review tool, if applicable. No additional management support is needed unless otherwise documented below in the visit note. 

## 2014-07-18 ENCOUNTER — Inpatient Hospital Stay (HOSPITAL_COMMUNITY): Payer: 59

## 2014-07-18 DIAGNOSIS — L539 Erythematous condition, unspecified: Secondary | ICD-10-CM

## 2014-07-18 DIAGNOSIS — I10 Essential (primary) hypertension: Secondary | ICD-10-CM

## 2014-07-18 DIAGNOSIS — A419 Sepsis, unspecified organism: Secondary | ICD-10-CM

## 2014-07-18 DIAGNOSIS — M7989 Other specified soft tissue disorders: Secondary | ICD-10-CM

## 2014-07-18 DIAGNOSIS — L03115 Cellulitis of right lower limb: Secondary | ICD-10-CM

## 2014-07-18 LAB — COMPREHENSIVE METABOLIC PANEL
ALBUMIN: 3.4 g/dL — AB (ref 3.5–5.0)
ALT: 31 U/L (ref 14–54)
AST: 25 U/L (ref 15–41)
Alkaline Phosphatase: 71 U/L (ref 38–126)
Anion gap: 7 (ref 5–15)
BUN: 13 mg/dL (ref 6–20)
CO2: 20 mmol/L — ABNORMAL LOW (ref 22–32)
CREATININE: 0.96 mg/dL (ref 0.44–1.00)
Calcium: 9.5 mg/dL (ref 8.9–10.3)
Chloride: 112 mmol/L — ABNORMAL HIGH (ref 101–111)
GFR calc Af Amer: >60 mL/min (ref >60)
Glucose, Bld: 102 mg/dL — ABNORMAL HIGH (ref 65–99)
Potassium: 3.9 mmol/L (ref 3.5–5.1)
Sodium: 139 mmol/L (ref 135–145)
Total Bilirubin: 0.9 mg/dL (ref 0.3–1.2)
Total Protein: 7.4 g/dL (ref 6.5–8.1)

## 2014-07-18 LAB — CBC
HEMATOCRIT: 33.2 % — AB (ref 36.0–46.0)
HEMOGLOBIN: 11 g/dL — AB (ref 12.0–15.0)
MCH: 28.3 pg (ref 26.0–34.0)
MCHC: 33.1 g/dL (ref 30.0–36.0)
MCV: 85.3 fL (ref 78.0–100.0)
PLATELETS: 215 10*3/uL (ref 150–400)
RBC: 3.89 MIL/uL (ref 3.87–5.11)
RDW: 13.9 % (ref 11.5–15.5)
WBC: 13.4 10*3/uL — AB (ref 4.0–10.5)

## 2014-07-18 LAB — GLUCOSE, CAPILLARY: GLUCOSE-CAPILLARY: 155 mg/dL — AB (ref 65–99)

## 2014-07-18 LAB — TSH: TSH: 2.961 u[IU]/mL (ref 0.350–4.500)

## 2014-07-18 MED ORDER — VANCOMYCIN HCL IN DEXTROSE 750-5 MG/150ML-% IV SOLN
750.0000 mg | Freq: Two times a day (BID) | INTRAVENOUS | Status: DC
  Administered 2014-07-18 – 2014-07-20 (×4): 750 mg via INTRAVENOUS
  Filled 2014-07-18 (×5): qty 150

## 2014-07-18 MED ORDER — SACCHAROMYCES BOULARDII 250 MG PO CAPS
250.0000 mg | ORAL_CAPSULE | Freq: Two times a day (BID) | ORAL | Status: DC
  Administered 2014-07-18 – 2014-07-21 (×7): 250 mg via ORAL
  Filled 2014-07-18 (×8): qty 1

## 2014-07-18 MED ORDER — PIPERACILLIN-TAZOBACTAM 3.375 G IVPB
3.3750 g | Freq: Three times a day (TID) | INTRAVENOUS | Status: DC
  Administered 2014-07-18 – 2014-07-20 (×7): 3.375 g via INTRAVENOUS
  Filled 2014-07-18 (×8): qty 50

## 2014-07-18 MED ORDER — SODIUM CHLORIDE 0.9 % IV SOLN
1750.0000 mg | Freq: Once | INTRAVENOUS | Status: AC
  Administered 2014-07-18: 1750 mg via INTRAVENOUS
  Filled 2014-07-18: qty 1750

## 2014-07-18 MED ORDER — LORAZEPAM 0.5 MG PO TABS
0.5000 mg | ORAL_TABLET | Freq: Once | ORAL | Status: AC
  Administered 2014-07-18: 0.5 mg via ORAL
  Filled 2014-07-18: qty 1

## 2014-07-18 MED ORDER — CLINDAMYCIN HCL 300 MG PO CAPS
600.0000 mg | ORAL_CAPSULE | Freq: Three times a day (TID) | ORAL | Status: DC
  Administered 2014-07-18 – 2014-07-21 (×10): 600 mg via ORAL
  Filled 2014-07-18 (×13): qty 2

## 2014-07-18 MED ORDER — TRAMADOL HCL 50 MG PO TABS
50.0000 mg | ORAL_TABLET | Freq: Four times a day (QID) | ORAL | Status: DC | PRN
  Administered 2014-07-18 – 2014-07-20 (×3): 50 mg via ORAL
  Filled 2014-07-18 (×3): qty 1

## 2014-07-18 NOTE — Progress Notes (Signed)
VASCULAR LAB PRELIMINARY  PRELIMINARY  PRELIMINARY  PRELIMINARY  Bilateral lower extremity venous duplexcompleted.    Preliminary report:  Bilateral:  No evidence of DVT, superficial thrombosis, or Baker's Cyst.   Countess Biebel, RVS 07/18/2014, 8:50 AM

## 2014-07-18 NOTE — Consult Note (Signed)
WOC wound consult note Reason for Consult:Patient of Dr. Dois Davenport with two breast lesions (abscesses that were previously drained). Patient already has follow up appointment with Dr. Redmond Pulling scheduled in July. Wound type: infectious, surgical Pressure Ulcer POA: No Measurement:Two lesions, proximal measures 2cm x 1cm x 5cm and distal measures 2cm x 1cm x 10cm Wound YNX:GZFPO, pink and granulating Drainage (amount, consistency, odor) serous Periwound:intact, clear Dressing procedure/placement/frequency:Dressing orders are already on chart and are what patient and her daughter have been doing at home. [place] nursing team will not follow, but will remain available to this patient, the nursing, surgical and medical teams.  Please re-consult if needed. Thanks, Maudie Flakes, MSN, RN, [place], [place], [place] [phone])

## 2014-07-18 NOTE — Progress Notes (Signed)
ANTIBIOTIC CONSULT NOTE - INITIAL  Pharmacy Consult for Vancomycin & Zosyn Indication: Cellulitis  No Known Allergies  Patient Measurements:   Weight: 118.4 kg Height: 65 inches  Vital Signs: Temp: 99.7 F (37.6 C) (06/16 2346) Temp Source: Oral (06/16 2346) BP: 159/67 mmHg (06/16 2346) Pulse Rate: 121 (06/16 2346) Intake/Output from previous day:   Intake/Output from this shift:    Labs:  Recent Labs  07/17/14 2120  WBC 12.5*  HGB 12.0  PLT 251  CREATININE 1.13*   Estimated Creatinine Clearance: 71.6 mL/min (by C-G formula based on Cr of 1.13). No results for input(s): VANCOTROUGH, VANCOPEAK, VANCORANDOM, GENTTROUGH, GENTPEAK, GENTRANDOM, TOBRATROUGH, TOBRAPEAK, TOBRARND, AMIKACINPEAK, AMIKACINTROU, AMIKACIN in the last 72 hours.   Microbiology: No results found for this or any previous visit (from the past 720 hour(s)).  Medical History: Past Medical History  Diagnosis Date  . Allergy     perennial  . Hypertension   . Anemia     heavy menses  . Nonspecific elevation of levels of transaminase or lactic acid dehydrogenase (LDH) 2011    ALT 43  . Impaired fasting glucose 2011    108    Medications:  Scheduled:  . docusate sodium  100 mg Oral BID  . enoxaparin (LOVENOX) injection  60 mg Subcutaneous QHS  . folic acid  1 mg Oral Daily  . loratadine  10 mg Oral Daily  . losartan  100 mg Oral Daily  . multivitamin with minerals  1 tablet Oral Daily  . piperacillin-tazobactam  3.375 g Intravenous Once  . piperacillin-tazobactam (ZOSYN)  IV  3.375 g Intravenous Q8H  . thiamine  100 mg Oral Daily  . vancomycin  1,750 mg Intravenous Once  . vancomycin  750 mg Intravenous Q12H  . verapamil  120 mg Oral BID   Infusions:  . sodium chloride     Assessment:  54 yr female with right lower extremity cellulitis  Blood cultures ordered  Pharmacy consulted to dose Vancomycin and Zosyn for treatment of cellulitis  Goal of Therapy:  Vancomycin trough level  10-15 mcg/ml  Plan:  Measure antibiotic drug levels at steady state Follow up culture results   Zosyn 3.375gm IV q8h (each dose infused over 4 hrs)  Vancomycin 1750mg  IV x 1 then 750mg  IV q12h  Everette Rank, PharmD 07/18/2014,12:03 AM

## 2014-07-18 NOTE — Progress Notes (Signed)
Patient ID: Rebecca Foley, female   DOB: 1960/07/25, 54 y.o.   MRN: [id number]    Subjective: Pt known to Dr. Redmond Foley for h/o breast abscess, s/p I&D 06-17-14.  She was admitted for lower extremity cellulitis.  Her daughter called our office today to alert [place] her packing had not been changed in about 24 hours and asked if we could stop by and get that straightened out.  Objective: Vital signs in last 24 hours: Temp:  [98.1 F (36.7 C)-101.1 F (38.4 C)] 99.2 F (37.3 C) (06/17 0847) Pulse Rate:  [101-121] 101 (06/17 0525) Resp:  [18-22] 20 (06/17 0525) BP: (140-168)/(66-83) 140/66 mmHg (06/17 0525) SpO2:  [96 %-100 %] 96 % (06/17 0525) Weight:  [118.389 kg (261 lb)] 118.389 kg (261 lb) (06/16 1545)    Intake/Output from previous day: 06/16 0701 - 06/17 0700 In: 754.2 [I.V.:204.2; IV Piggyback:550] Out: -  Intake/Output this shift:    PE: Breast: right breast wounds are very clean.  Packing removed.  No purulent drainage  Lab Results:   Recent Labs  07/17/14 2120 07/18/14 0220  WBC 12.5* 13.4*  HGB 12.0 11.0*  HCT 36.2 33.2*  PLT 251 215   BMET  Recent Labs  07/17/14 2120 07/18/14 0220  NA 141 139  K 4.0 3.9  CL 110 112*  CO2 21* 20*  GLUCOSE 92 102*  BUN 15 13  CREATININE 1.13* 0.96  CALCIUM 10.2 9.5   PT/INR No results for input(s): LABPROT, INR in the last 72 hours. CMP     Component Value Date/Time   NA 139 07/18/2014 0220   K 3.9 07/18/2014 0220   CL 112* 07/18/2014 0220   CO2 20* 07/18/2014 0220   GLUCOSE 102* 07/18/2014 0220   BUN 13 07/18/2014 0220   CREATININE 0.96 07/18/2014 0220   CALCIUM 9.5 07/18/2014 0220   PROT 7.4 07/18/2014 0220   ALBUMIN 3.4* 07/18/2014 0220   AST 25 07/18/2014 0220   ALT 31 07/18/2014 0220   ALKPHOS 71 07/18/2014 0220   BILITOT 0.9 07/18/2014 0220   GFRNONAA >60 07/18/2014 0220   GFRAA >60 07/18/2014 0220   Lipase  No results found for: LIPASE     Studies/Results: Dg Tibia/fibula Right  07/18/2014    CLINICAL DATA:  Right leg swelling below the knee with redness, initial encounter  EXAM: RIGHT TIBIA AND FIBULA - 2 VIEW  COMPARISON:  None.  FINDINGS: Mild degenerative changes of the knee joint are seen. Diffuse soft tissue edema is noted consistent with the history. No acute fracture or dislocation is noted.  IMPRESSION: Soft tissue edema without acute bony abnormality.   Electronically Signed   By: Rebecca Foley M.D.   On: 07/18/2014 09:50    Anti-infectives: Anti-infectives    Start     Dose/Rate Route Frequency Ordered Stop   07/18/14 1300  vancomycin (VANCOCIN) IVPB 750 mg/150 ml premix     750 mg 150 mL/hr over 60 Minutes Intravenous Every 12 hours 07/18/14 0000     07/18/14 0800  piperacillin-tazobactam (ZOSYN) IVPB 3.375 g     3.375 g 12.5 mL/hr over 240 Minutes Intravenous Every 8 hours 07/18/14 0002     07/18/14 0800  clindamycin (CLEOCIN) capsule 600 mg     600 mg Oral Every 8 hours 07/18/14 0727     07/18/14 0100  vancomycin (VANCOCIN) 1,750 mg in sodium chloride 0.9 % 500 mL IVPB     1,750 mg 250 mL/hr over 120 Minutes Intravenous  Once 07/18/14  0000 07/18/14 0240   07/17/14 2359  piperacillin-tazobactam (ZOSYN) IVPB 3.375 g     3.375 g 100 mL/hr over 30 Minutes Intravenous  Once 07/17/14 2344 07/18/14 0109   07/17/14 2345  vancomycin (VANCOCIN) IVPB 1000 mg/200 mL premix  Status:  Discontinued     1,000 mg 200 mL/hr over 60 Minutes Intravenous  Once 07/17/14 2344 07/17/14 2359   07/17/14 2200  clindamycin (CLEOCIN) IVPB 600 mg     600 mg 100 mL/hr over 30 Minutes Intravenous  Once 07/17/14 2200 07/17/14 2310       Assessment/Plan  1. S/p I&D of breast abscess -order already written for daily dressing changes with 1/4" iodoform gauze.   -wounds are otherwise clean -she has an appointment to follow up with Dr. Redmond Foley in July.     LOS: 1 day    Lavelle Berland E 07/18/2014, 10:16 AM Pager: [phone]

## 2014-07-18 NOTE — Progress Notes (Signed)
Initial Nutrition Assessment  DOCUMENTATION CODES:  Morbid obesity  INTERVENTION: - Continue Heart Healthy/Carb Modified diet - RD will continue to monitor for needs  NUTRITION DIAGNOSIS:  Inadequate oral intake related to acute illness, nausea as evidenced by per patient/family report.  GOAL:  Patient will meet greater than or equal to 90% of their needs  MONITOR:  PO intake, Weight trends, Labs, I & O's  REASON FOR ASSESSMENT:  Malnutrition Screening Tool  ASSESSMENT: Per H&P, 54 y.o. female with history of HTN and hyperglycemia in the past presents with cellulitis. Patient was seen in the office with complaints of swelling of the right foot for about 3 days. She states that she has been feeling sluggish and today decided to call her PCP where she was evaluated and sent here to the ED. Patient states that she has been feeling weak. She has not noted any fever. She has had severe pain in the leg and has felt tingly. Patient states that she has been having difficulty standing and walking. She states that she does not have diabetes.  Pt seen for MST. BMI indicates morbid obesity. Pt reports that she was able to eat breakfast and lunch today without issue. She indicates that PTA she was having nausea without vomiting for 2 days and did not eat during this time. Prior to this acute issue, pt reports good appetite with no issues.   She states she may have lost a few pounds recently but feels weight is relatively stable. Per weight hx review, she has lost 5 lbs (2% body weight) in 1 month which is not significant for time frame.  Expect pt to meet needs with resolution of nausea. No muscle or fat wasting present. Medications reviewed. Labs reviewed.  Height:  Ht Readings from Last 1 Encounters:  06/17/14 5\' 5"  (1.651 m)    Weight:  Wt Readings from Last 1 Encounters:  07/17/14 261 lb (118.389 kg)    Ideal Body Weight:  56.8 kg  Wt Readings from Last 10 Encounters:   07/17/14 261 lb (118.389 kg)  06/17/14 266 lb (120.657 kg)  01/07/14 265 lb (120.203 kg)  12/22/13 250 lb (113.399 kg)  03/30/11 269 lb (122.018 kg)  10/27/09 248 lb (112.492 kg)  07/08/09 247 lb 3.2 oz (112.129 kg)  03/24/08 239 lb 12.8 oz (108.773 kg)  07/03/07 225 lb (102.059 kg)  02/23/07 224 lb 8 oz (101.833 kg)    BMI:    Estimated Nutritional Needs:  Kcal:  7989-2119  Protein:  75-85 grams  Fluid:  2 L/day  Skin:  Wound (see comment) (R breast wound (present on admission))  Diet Order:  Diet heart healthy/carb modified Room service appropriate?: Yes; Fluid consistency:: Thin  EDUCATION NEEDS:  No education needs identified at this time   Intake/Output Summary (Last 24 hours) at 07/18/14 1426 Last data filed at 07/18/14 1323  Gross per 24 hour  Intake 994.17 ml  Output      0 ml  Net 994.17 ml    Last BM:  PTA   Jarome Matin, RD, LDN Inpatient Clinical Dietitian Pager # [phone] After hours/weekend pager # [phone]

## 2014-07-18 NOTE — Progress Notes (Signed)
[facility] PROGRESS NOTE  PATTIANN SOLANKI YJW:[id number] DOB: Oct 27, 1960 DOA: 07/17/2014 PCP: Unice Cobble, MD  Brief Summary  Rebecca Foley is a 54 y.o. female with history of HTN and hyperglycemia in the past presents with cellulitis. Patient was seen in the office with complaints of swelling of the right foot for about 3 days and feeling sluggish and weak.  She was sent to the ED where she was found to have significant swelling and redness of the right leg consistent with cellulitis.  She was started on vanc and zosyn but spiked a fever to 101.73F.   Assessment/Plan  Sepsis (fever, tachycardia, leukocytosis) secondary to cellulitis of the right lower extremity, high fevers and general malaise and mild nausea are concerning for possible necrotizing fasciitis -  Lactic acid negative -  Continue vancomycin and Zosyn -  Add clindamycin -  Add florastor -  X-ray of the right leg: No evidence of subcutaneous air -  Duplex lower extremity: Negative for DVT -  Low threshold to consult orthopedics if infection worsens -  Continue IVF -  HIV and A1c  Essential hypertension, blood pressure stable, continue the certain  Elevated creatinine secondary to mild dehydration and sepsis, trending down with IV fluids.  Right breast abscess, once daily repacking with iodoform and dry dressing per RN.  Diet:  Diabetic/healthy heart Access:  PIV IVF:  Yes Proph:  Lovenox  Code Status: Full code Family Communication: Patient alone Disposition Plan: Pending clinical improvement in her right lower extremity pain, swelling, redness, fevers   Consultants: -  none, however general surgery came by is a favor to the family to evaluate the right breast abscess area since the patient was admitted to the hospital.  Procedures:  Duplex right lower extremity on 6/17: Negative for DVT X-ray of the right tibia/fibula on 6/17: Negative for subcutaneous air   Antibiotics:  vanc 6/16  Zosyn  6/16  Clindamycin 6/16 >>   HPI/Subjective:  Pain, swelling have improved somewhat since starting antibiotics. Her calf she states is much softer than before. She continues to have bright erythema up to just below the knee which has not receded. She had a high fever recently. She continues to have some nausea.  Objective: Filed Vitals:   07/17/14 2346 07/18/14 0525 07/18/14 0847 07/18/14 1313  BP: 159/67 140/66  139/63  Pulse: 121 101  109  Temp: 99.7 F (37.6 C) 101.1 F (38.4 C) 99.2 F (37.3 C) 99.1 F (37.3 C)  TempSrc: Oral Oral  Oral  Resp: 18 20  18   SpO2: 100% 96%  97%    Intake/Output Summary (Last 24 hours) at 07/18/14 1854 Last data filed at 07/18/14 1323  Gross per 24 hour  Intake 994.17 ml  Output      0 ml  Net 994.17 ml   There were no vitals filed for this visit.  Exam:   General:   adult female, No acute distress  HEENT:  NCAT, MMM  Cardiovascular:  RRR, nl S1, S2 no mrg, 2+ pulses, warm extremities  Respiratory:  CTAB, no increased WOB  Abdomen:   NABS, soft, NT/ND  MSK:   Normal tone and bulk, bright pinkish red erythema in the distribution of a knee-high sock of the right lower extremity. Warm to the touch, tender, and swollen but soft. In the medial shin she has some slightly purplish discoloration and on the medial shin, she does not have any blisters but the skin is starting to appear somewhat bubbly.  Neuro:  Grossly intact  Data Reviewed: Basic Metabolic Panel:  Recent Labs Lab 07/17/14 2120 07/18/14 0220  NA 141 139  K 4.0 3.9  CL 110 112*  CO2 21* 20*  GLUCOSE 92 102*  BUN 15 13  CREATININE 1.13* 0.96  CALCIUM 10.2 9.5   Liver Function Tests:  Recent Labs Lab 07/18/14 0220  AST 25  ALT 31  ALKPHOS 71  BILITOT 0.9  PROT 7.4  ALBUMIN 3.4*   No results for input(s): LIPASE, AMYLASE in the last 168 hours. No results for input(s): AMMONIA in the last 168 hours. CBC:  Recent Labs Lab 07/17/14 2120 07/18/14 0220   WBC 12.5* 13.4*  NEUTROABS 9.7*  --   HGB 12.0 11.0*  HCT 36.2 33.2*  MCV 87.0 85.3  PLT 251 215   Cardiac Enzymes: No results for input(s): CKTOTAL, CKMB, CKMBINDEX, TROPONINI in the last 168 hours. BNP (last 3 results) No results for input(s): BNP in the last 8760 hours.  ProBNP (last 3 results) No results for input(s): PROBNP in the last 8760 hours.  CBG:  Recent Labs Lab 07/18/14 0715  GLUCAP 155*    No results found for this or any previous visit (from the past 240 hour(s)).   Studies: Dg Tibia/fibula Right  07/18/2014   CLINICAL DATA:  Right leg swelling below the knee with redness, initial encounter  EXAM: RIGHT TIBIA AND FIBULA - 2 VIEW  COMPARISON:  None.  FINDINGS: Mild degenerative changes of the knee joint are seen. Diffuse soft tissue edema is noted consistent with the history. No acute fracture or dislocation is noted.  IMPRESSION: Soft tissue edema without acute bony abnormality.   Electronically Signed   By: Inez Catalina M.D.   On: 07/18/2014 09:50    Scheduled Meds: . clindamycin  600 mg Oral Q8H  . docusate sodium  100 mg Oral BID  . enoxaparin (LOVENOX) injection  60 mg Subcutaneous QHS  . folic acid  1 mg Oral Daily  . loratadine  10 mg Oral Daily  . losartan  100 mg Oral Daily  . multivitamin with minerals  1 tablet Oral Daily  . piperacillin-tazobactam (ZOSYN)  IV  3.375 g Intravenous Q8H  . saccharomyces boulardii  250 mg Oral BID  . thiamine  100 mg Oral Daily  . vancomycin  750 mg Intravenous Q12H  . verapamil  120 mg Oral BID   Continuous Infusions: . sodium chloride 50 mL/hr at 07/18/14 5701    Active Problems:   Essential hypertension   Cellulitis of right lower leg   Cellulitis    Time spent: 30 min    Forest Pruden, [place] [facility] Pager [phone]. If 7PM-7AM, please contact night-coverage at [email or web address], password [facility] 07/18/2014, 6:54 PM  LOS: 1 day

## 2014-07-19 DIAGNOSIS — A419 Sepsis, unspecified organism: Principal | ICD-10-CM

## 2014-07-19 LAB — CBC
HEMATOCRIT: 30.6 % — AB (ref 36.0–46.0)
HEMOGLOBIN: 10.1 g/dL — AB (ref 12.0–15.0)
MCH: 28.6 pg (ref 26.0–34.0)
MCHC: 33 g/dL (ref 30.0–36.0)
MCV: 86.7 fL (ref 78.0–100.0)
Platelets: 249 10*3/uL (ref 150–400)
RBC: 3.53 MIL/uL — AB (ref 3.87–5.11)
RDW: 14.2 % (ref 11.5–15.5)
WBC: 13.5 10*3/uL — ABNORMAL HIGH (ref 4.0–10.5)

## 2014-07-19 LAB — BASIC METABOLIC PANEL
Anion gap: 7 (ref 5–15)
BUN: 11 mg/dL (ref 6–20)
CO2: 21 mmol/L — AB (ref 22–32)
Calcium: 9 mg/dL (ref 8.9–10.3)
Chloride: 113 mmol/L — ABNORMAL HIGH (ref 101–111)
Creatinine, Ser: 1.06 mg/dL — ABNORMAL HIGH (ref 0.44–1.00)
GFR calc Af Amer: >60 mL/min (ref >60)
GFR calc non Af Amer: 58 mL/min — ABNORMAL LOW (ref >60)
Glucose, Bld: 91 mg/dL (ref 65–99)
Potassium: 3.7 mmol/L (ref 3.5–5.1)
SODIUM: 141 mmol/L (ref 135–145)

## 2014-07-19 LAB — GLUCOSE, CAPILLARY: Glucose-Capillary: 76 mg/dL (ref 65–99)

## 2014-07-19 LAB — OCCULT BLOOD X 1 CARD TO LAB, STOOL: Fecal Occult Bld: NEGATIVE

## 2014-07-19 LAB — HEMOGLOBIN A1C
HEMOGLOBIN A1C: 6.1 % — AB (ref 4.8–5.6)
Mean Plasma Glucose: 128 mg/dL

## 2014-07-19 LAB — HIV ANTIBODY (ROUTINE TESTING W REFLEX): HIV SCREEN 4TH GENERATION: NONREACTIVE

## 2014-07-19 MED ORDER — LORAZEPAM 0.5 MG PO TABS
0.5000 mg | ORAL_TABLET | Freq: Once | ORAL | Status: AC
  Administered 2014-07-19: 0.5 mg via ORAL
  Filled 2014-07-19: qty 1

## 2014-07-19 NOTE — Progress Notes (Addendum)
[facility] PROGRESS NOTE  Rebecca Foley OZD:[id number] DOB: [birth year] DOA: 07/17/2014 PCP: Unice Cobble, MD  Brief Summary  Rebecca Foley is a 54 y.o. female with history of HTN and hyperglycemia in the past presents with cellulitis. Patient was seen in the office with complaints of swelling of the right foot for about 3 days and feeling sluggish and weak.  She was sent to the ED where she was found to have significant swelling and redness of the right leg consistent with cellulitis.  She was started on vanc and zosyn but spiked a fever to 101.13F.   Assessment/Plan  Sepsis (fever, tachycardia, leukocytosis) secondary to cellulitis of the right lower extremity.  Fevers resolving, but leg erythema has spread above the area of previous line and still appears red, swollen, warm and TTP. -  Lactic acid negative -  Continue vancomycin and Zosyn -  Continue clindamycin -  Continue florastor -  X-ray of the right leg: No evidence of subcutaneous air -  Duplex lower extremity: Negative for DVT -  Low threshold to consult orthopedics if infection worsens -  D/c IVF -  HIV pending and A1c 6.1  Essential hypertension, blood pressure stable, continue the certain  Elevated creatinine secondary to mild dehydration and sepsis, trending down with IV fluids.  Right breast abscess, once daily repacking with iodoform and dry dressing per RN.  Leukocytosis, likely related to underlying infection -  abx as above -  Repeat WBC in AM  Normocytic anemia -  Iron studies, B12, folate -  TSH 2.961 -  Occult stool -  Repeat hgb in AM  Diet:  Diabetic/healthy heart Access:  PIV IVF:  off Proph:  Lovenox  Code Status: Full code Family Communication: Patient alone Disposition Plan: Pending clinical improvement in her right lower extremity pain, swelling, redness, fevers   Consultants: -  none, however general surgery came by is a favor to the family to evaluate the right breast abscess  area since the patient was admitted to the hospital.  Procedures:  Duplex right lower extremity on 6/17: Negative for DVT X-ray of the right tibia/fibula on 6/17: Negative for subcutaneous air   Antibiotics:  vanc 6/16  Zosyn 6/16  Clindamycin 6/16 >>   HPI/Subjective:  Headache has improved.  Swelling in leg has improved some, but she continues to have pain and redness.  Slept better after pain pill last night..  Objective: Filed Vitals:   07/18/14 1313 07/18/14 2101 07/19/14 0516 07/19/14 1027  BP: 139/63 138/62 132/56 123/69  Pulse: 109 99 96   Temp: 99.1 F (37.3 C) 98.3 F (36.8 C) 98.7 F (37.1 C)   TempSrc: Oral Axillary Oral   Resp: 18 18 18    SpO2: 97% 99% 100%     Intake/Output Summary (Last 24 hours) at 07/19/14 1130 Last data filed at 07/19/14 0536  Gross per 24 hour  Intake 1683.33 ml  Output      0 ml  Net 1683.33 ml   There were no vitals filed for this visit.  Exam:   General:   adult female, No acute distress  HEENT:  NCAT, MMM  Cardiovascular:  RRR, nl S1, S2 no mrg, 2+ pulses, warm extremities  Respiratory:  CTAB, no increased WOB  Abdomen:   NABS, soft, NT/ND  MSK:   Normal tone and bulk, bright pinkish red erythema in the distribution of a knee-high sock of the right lower extremity, now extending above the line I drew yesterday. Warm to  the touch, tender, and swollen but soft. In the medial shin she has some slightly purplish discoloration and on the medial shin, she does not have any blisters but the skin is starting to appear somewhat bubbly.  2nd toe is now erythematous and swollen.    Neuro:  Grossly intact  Data Reviewed: Basic Metabolic Panel:  Recent Labs Lab 07/17/14 2120 07/18/14 0220 07/19/14 0536  NA 141 139 141  K 4.0 3.9 3.7  CL 110 112* 113*  CO2 21* 20* 21*  GLUCOSE 92 102* 91  BUN 15 13 11   CREATININE 1.13* 0.96 1.06*  CALCIUM 10.2 9.5 9.0   Liver Function Tests:  Recent Labs Lab 07/18/14 0220  AST  25  ALT 31  ALKPHOS 71  BILITOT 0.9  PROT 7.4  ALBUMIN 3.4*   No results for input(s): LIPASE, AMYLASE in the last 168 hours. No results for input(s): AMMONIA in the last 168 hours. CBC:  Recent Labs Lab 07/17/14 2120 07/18/14 0220 07/19/14 0536  WBC 12.5* 13.4* 13.5*  NEUTROABS 9.7*  --   --   HGB 12.0 11.0* 10.1*  HCT 36.2 33.2* 30.6*  MCV 87.0 85.3 86.7  PLT 251 215 249   Cardiac Enzymes: No results for input(s): CKTOTAL, CKMB, CKMBINDEX, TROPONINI in the last 168 hours. BNP (last 3 results) No results for input(s): BNP in the last 8760 hours.  ProBNP (last 3 results) No results for input(s): PROBNP in the last 8760 hours.  CBG:  Recent Labs Lab 07/18/14 0715 07/19/14 0752  GLUCAP 155* 76    No results found for this or any previous visit (from the past 240 hour(s)).   Studies: Dg Tibia/fibula Right  07/18/2014   CLINICAL DATA:  Right leg swelling below the knee with redness, initial encounter  EXAM: RIGHT TIBIA AND FIBULA - 2 VIEW  COMPARISON:  None.  FINDINGS: Mild degenerative changes of the knee joint are seen. Diffuse soft tissue edema is noted consistent with the history. No acute fracture or dislocation is noted.  IMPRESSION: Soft tissue edema without acute bony abnormality.   Electronically Signed   By: Inez Catalina M.D.   On: 07/18/2014 09:50    Scheduled Meds: . clindamycin  600 mg Oral Q8H  . docusate sodium  100 mg Oral BID  . enoxaparin (LOVENOX) injection  60 mg Subcutaneous QHS  . folic acid  1 mg Oral Daily  . loratadine  10 mg Oral Daily  . losartan  100 mg Oral Daily  . multivitamin with minerals  1 tablet Oral Daily  . piperacillin-tazobactam (ZOSYN)  IV  3.375 g Intravenous Q8H  . saccharomyces boulardii  250 mg Oral BID  . thiamine  100 mg Oral Daily  . vancomycin  750 mg Intravenous Q12H  . verapamil  120 mg Oral BID   Continuous Infusions: . sodium chloride 50 mL/hr at 07/19/14 0000    Active Problems:   Essential  hypertension   Cellulitis of right lower leg   Cellulitis   Sepsis    Time spent: 30 min    Rebecca Foley, [place] [facility] Pager [phone]. If 7PM-7AM, please contact night-coverage at [email or web address], password [facility] 07/19/2014, 11:30 AM  LOS: 2 days

## 2014-07-20 LAB — IRON AND TIBC
Iron: 52 ug/dL (ref 28–170)
Saturation Ratios: 16 % (ref 10.4–31.8)
TIBC: 332 ug/dL (ref 250–450)
UIBC: 280 ug/dL

## 2014-07-20 LAB — BASIC METABOLIC PANEL
Anion gap: 6 (ref 5–15)
BUN: 13 mg/dL (ref 6–20)
CO2: 21 mmol/L — ABNORMAL LOW (ref 22–32)
Calcium: 9.2 mg/dL (ref 8.9–10.3)
Chloride: 116 mmol/L — ABNORMAL HIGH (ref 101–111)
Creatinine, Ser: 1.05 mg/dL — ABNORMAL HIGH (ref 0.44–1.00)
GFR calc Af Amer: >60 mL/min (ref >60)
GFR calc non Af Amer: 58 mL/min — ABNORMAL LOW (ref >60)
Glucose, Bld: 89 mg/dL (ref 65–99)
POTASSIUM: 3.9 mmol/L (ref 3.5–5.1)
SODIUM: 143 mmol/L (ref 135–145)

## 2014-07-20 LAB — GLUCOSE, CAPILLARY: Glucose-Capillary: 88 mg/dL (ref 65–99)

## 2014-07-20 LAB — CBC
HEMATOCRIT: 31.8 % — AB (ref 36.0–46.0)
Hemoglobin: 10.3 g/dL — ABNORMAL LOW (ref 12.0–15.0)
MCH: 28.1 pg (ref 26.0–34.0)
MCHC: 32.4 g/dL (ref 30.0–36.0)
MCV: 86.9 fL (ref 78.0–100.0)
PLATELETS: 282 10*3/uL (ref 150–400)
RBC: 3.66 MIL/uL — AB (ref 3.87–5.11)
RDW: 14.1 % (ref 11.5–15.5)
WBC: 15.4 10*3/uL — ABNORMAL HIGH (ref 4.0–10.5)

## 2014-07-20 LAB — FERRITIN: FERRITIN: 85 ng/mL (ref 11–307)

## 2014-07-20 LAB — CLOSTRIDIUM DIFFICILE BY PCR: CDIFFPCR: NEGATIVE

## 2014-07-20 LAB — FOLATE: Folate: 11.2 ng/mL (ref >5.9)

## 2014-07-20 LAB — VITAMIN B12: Vitamin B-12: 173 pg/mL — ABNORMAL LOW (ref 180–914)

## 2014-07-20 MED ORDER — DIPHENOXYLATE-ATROPINE 2.5-0.025 MG PO TABS
2.0000 | ORAL_TABLET | Freq: Four times a day (QID) | ORAL | Status: DC | PRN
  Administered 2014-07-20 – 2014-07-21 (×2): 2 via ORAL
  Filled 2014-07-20 (×2): qty 2

## 2014-07-20 MED ORDER — CYANOCOBALAMIN 1000 MCG/ML IJ SOLN
1000.0000 ug | Freq: Once | INTRAMUSCULAR | Status: AC
  Administered 2014-07-20: 1000 ug via INTRAMUSCULAR
  Filled 2014-07-20: qty 1

## 2014-07-20 MED ORDER — VITAMIN B-12 1000 MCG PO TABS
1000.0000 ug | ORAL_TABLET | Freq: Every day | ORAL | Status: DC
  Administered 2014-07-21: 1000 ug via ORAL
  Filled 2014-07-20: qty 1

## 2014-07-20 NOTE — Progress Notes (Signed)
[facility] PROGRESS NOTE  Rebecca Foley OBS:[id number] DOB: 22-Nov-1960 DOA: 07/17/2014 PCP: Unice Cobble, MD  Brief Summary  Rebecca Foley is a 54 y.o. female with history of HTN and hyperglycemia in the past presents with cellulitis. Patient was seen in the office with complaints of swelling of the right foot for about 3 days and feeling sluggish and weak.  She was sent to the ED where she was found to have significant swelling and redness of the right leg consistent with cellulitis.  She was started on vanc and zosyn but spiked a fever to 101.61F.   Assessment/Plan  Sepsis (fever, tachycardia, leukocytosis) secondary to cellulitis of the right lower extremity.  Fevers and erythema of right leg are resolving, but WBC continues to trend up. -  Lactic acid negative -  D/c vancomycin and Zosyn -  Continue clindamycin -  Continue florastor -  X-ray of the right leg: No evidence of subcutaneous air -  Duplex lower extremity: Negative for DVT -  HIV NR and A1c 6.1  Diarrhea, may be C. Diff which would explain worsening leukocytosis in the setting of improving cellulitis -  Enteric precautions -  C. Diff PCR  Essential hypertension, blood pressure stable, continue losartan and verapamil  Elevated creatinine secondary to mild dehydration and sepsis, trending down with IV fluids.  Right breast abscess, once daily repacking with iodoform and dry dressing per RN.  Leukocytosis, worsening, concerning for C. diff -  Repeat WBC in AM  Vitamin B12 deficiency anemia.  Given body habitus and no known diet restrictions, suspect pernicious anemia -  Iron studies and folate wnl -  TSH 2.961 -  Vitamin B12 173 -  Check anti-IF and anti-parietal cells -  Start b12 supplementation   Diet:  Diabetic/healthy heart Access:  PIV IVF:  off Proph:  Lovenox  Code Status: Full code Family Communication: Patient alone Disposition Plan:  Awaiting C. Diff PCR.  Anticipate discharge tomorrow  depending on diarrhea.   Consultants: -  none, however general surgery came by is a favor to the family to evaluate the right breast abscess area since the patient was admitted to the hospital.  Procedures:  Duplex right lower extremity on 6/17: Negative for DVT X-ray of the right tibia/fibula on 6/17: Negative for subcutaneous air   Antibiotics:  vanc 6/16 > 6/19  Zosyn 6/16 > 6/19  Clindamycin 6/16 >>   HPI/Subjective:  Leg is much less red, painful, and swollen.  Having frequent watery stools however with some stool incontinence.  Denies nausea, vomiting, abdominal pain  Objective: Filed Vitals:   07/19/14 1408 07/19/14 2300 07/20/14 0600 07/20/14 0936  BP: 129/64 130/60 125/64 135/68  Pulse: 94 97 91   Temp: 98.9 F (37.2 C) 98.3 F (36.8 C) 98.7 F (37.1 C)   TempSrc: Oral Oral Oral   Resp: 18 20    SpO2: 99% 100% 99%     Intake/Output Summary (Last 24 hours) at 07/20/14 1525 Last data filed at 07/20/14 0600  Gross per 24 hour  Intake    730 ml  Output      4 ml  Net    726 ml   There were no vitals filed for this visit.  Exam:   General:   adult female, No acute distress  HEENT:  NCAT, MMM  Cardiovascular:  RRR, nl S1, S2 no mrg, 2+ pulses, warm extremities  Respiratory:  CTAB, no increased WOB  Abdomen:   Hyperactive BS, soft, NT/ND  MSK:   Normal tone and bulk, markedly improved erythema (less pink) that has receded from below knee.  Skin is wrinkled and soft, no areas of necrotic discoloration.  Less warm to touch and less tender.    Neuro:  Grossly intact  Data Reviewed: Basic Metabolic Panel:  Recent Labs Lab 07/17/14 2120 07/18/14 0220 07/19/14 0536 07/20/14 0520  NA 141 139 141 143  K 4.0 3.9 3.7 3.9  CL 110 112* 113* 116*  CO2 21* 20* 21* 21*  GLUCOSE 92 102* 91 89  BUN 15 13 11 13   CREATININE 1.13* 0.96 1.06* 1.05*  CALCIUM 10.2 9.5 9.0 9.2   Liver Function Tests:  Recent Labs Lab 07/18/14 0220  AST 25  ALT 31   ALKPHOS 71  BILITOT 0.9  PROT 7.4  ALBUMIN 3.4*   No results for input(s): LIPASE, AMYLASE in the last 168 hours. No results for input(s): AMMONIA in the last 168 hours. CBC:  Recent Labs Lab 07/17/14 2120 07/18/14 0220 07/19/14 0536 07/20/14 0520  WBC 12.5* 13.4* 13.5* 15.4*  NEUTROABS 9.7*  --   --   --   HGB 12.0 11.0* 10.1* 10.3*  HCT 36.2 33.2* 30.6* 31.8*  MCV 87.0 85.3 86.7 86.9  PLT 251 215 249 282   Cardiac Enzymes: No results for input(s): CKTOTAL, CKMB, CKMBINDEX, TROPONINI in the last 168 hours. BNP (last 3 results) No results for input(s): BNP in the last 8760 hours.  ProBNP (last 3 results) No results for input(s): PROBNP in the last 8760 hours.  CBG:  Recent Labs Lab 07/18/14 0715 07/19/14 0752 07/20/14 0717  GLUCAP 155* 76 88    Recent Results (from the past 240 hour(s))  Culture, blood (routine x 2)     Status: None (Preliminary result)   Collection Time: 07/18/14  2:20 AM  Result Value Ref Range Status   Specimen Description BLOOD RIGHT HAND  Final   Special Requests BOTTLES DRAWN AEROBIC ONLY 5CC  Final   Culture   Final    NO GROWTH 2 DAYS Performed at [facility]    Report Status PENDING  Incomplete     Studies: No results found.  Scheduled Meds: . clindamycin  600 mg Oral Q8H  . cyanocobalamin  1,000 mcg Intramuscular Once  . enoxaparin (LOVENOX) injection  60 mg Subcutaneous QHS  . folic acid  1 mg Oral Daily  . loratadine  10 mg Oral Daily  . losartan  100 mg Oral Daily  . multivitamin with minerals  1 tablet Oral Daily  . saccharomyces boulardii  250 mg Oral BID  . thiamine  100 mg Oral Daily  . verapamil  120 mg Oral BID  . [START ON 07/21/2014] vitamin B-12  1,000 mcg Oral Daily   Continuous Infusions:    Active Problems:   Essential hypertension   Cellulitis of right lower leg   Cellulitis   Sepsis    Time spent: 30 min    Kambryn Dapolito, [facility] Pager [phone]. If 7PM-7AM, please  contact night-coverage at [email or web address], password [facility] 07/20/2014, 3:25 PM  LOS: 3 days

## 2014-07-21 DIAGNOSIS — D72829 Elevated white blood cell count, unspecified: Secondary | ICD-10-CM

## 2014-07-21 DIAGNOSIS — D519 Vitamin B12 deficiency anemia, unspecified: Secondary | ICD-10-CM

## 2014-07-21 LAB — CBC
HCT: 31.6 % — ABNORMAL LOW (ref 36.0–46.0)
Hemoglobin: 10.5 g/dL — ABNORMAL LOW (ref 12.0–15.0)
MCH: 29 pg (ref 26.0–34.0)
MCHC: 33.2 g/dL (ref 30.0–36.0)
MCV: 87.3 fL (ref 78.0–100.0)
Platelets: 310 10*3/uL (ref 150–400)
RBC: 3.62 MIL/uL — AB (ref 3.87–5.11)
RDW: 14 % (ref 11.5–15.5)
WBC: 14.4 10*3/uL — AB (ref 4.0–10.5)

## 2014-07-21 LAB — BASIC METABOLIC PANEL
Anion gap: 7 (ref 5–15)
BUN: 15 mg/dL (ref 6–20)
CALCIUM: 9.4 mg/dL (ref 8.9–10.3)
CO2: 23 mmol/L (ref 22–32)
Chloride: 111 mmol/L (ref 101–111)
Creatinine, Ser: 1.16 mg/dL — ABNORMAL HIGH (ref 0.44–1.00)
GFR calc Af Amer: 60 mL/min — ABNORMAL LOW (ref >60)
GFR calc non Af Amer: 52 mL/min — ABNORMAL LOW (ref >60)
Glucose, Bld: 85 mg/dL (ref 65–99)
Potassium: 3.8 mmol/L (ref 3.5–5.1)
SODIUM: 141 mmol/L (ref 135–145)

## 2014-07-21 LAB — GLUCOSE, CAPILLARY: Glucose-Capillary: 91 mg/dL (ref 65–99)

## 2014-07-21 MED ORDER — CLINDAMYCIN HCL 300 MG PO CAPS
600.0000 mg | ORAL_CAPSULE | Freq: Three times a day (TID) | ORAL | Status: DC
Start: 2014-07-21 — End: 2015-07-22

## 2014-07-21 MED ORDER — SACCHAROMYCES BOULARDII 250 MG PO CAPS: 250.0000 mg | ORAL_CAPSULE | Freq: Two times a day (BID) | ORAL | Status: DC

## 2014-07-21 MED ORDER — CYANOCOBALAMIN 1000 MCG PO TABS: 1000.0000 ug | ORAL_TABLET | Freq: Every day | ORAL | Status: DC

## 2014-07-21 NOTE — Discharge Summary (Signed)
Physician Discharge Summary  Rebecca Foley DDU:[id number] DOB: [birth year] DOA: 07/17/2014  PCP: Unice Cobble, MD  Admit date: 07/17/2014 Discharge date: 07/21/2014  Recommendations for Outpatient Follow-up:  1. Follow-up with primary care doctor in 1 week reevaluation of cellulitis.  Please repeat CBC to follow up anemia and leukocytosis  2. Continue clindamycin and given Rx for florastor 3. Started vitamin B12 supplementation   Discharge Diagnoses:  Principal Problem:   Cellulitis of right lower leg Active Problems:   Essential hypertension   Cellulitis   Sepsis   Vitamin B12 deficiency anemia   Leukocytosis   Discharge Condition: stable, improved  Diet recommendation: regular  Wt Readings from Last 3 Encounters:  07/20/14 118.389 kg (261 lb)  07/17/14 118.389 kg (261 lb)  06/17/14 120.657 kg (266 lb)    History of present illness:   Rebecca Foley is a 54 y.o. female with history of HTN and hyperglycemia in the past presents with cellulitis. Patient was seen in the office with complaints of swelling of the right foot for about 3 days and feeling sluggish and weak. She was sent to the ED where she was found to have significant swelling and redness of the right leg consistent with cellulitis. She was started on vanc and zosyn but spiked a fever to 101.16F. Clindamycin was added and XR demonstrated no evidence of free air.  She clinically improved on this regimen and was transitioned to monotherapy clindamycin on which she continued to improved.  She developed some diarrhea which was likely a side effect of her clindamycin and tested negative for C. Diff diarrhea.  She was educated on the risk of C. Diff diarrhea and voiced understanding that she would need to seek immediate medical attention if she were to develop diarrhea in the next three months.    Hospital Course:   Sepsis (fever, tachycardia, leukocytosis) secondary to cellulitis of the right lower extremity. Fevers  and erythema of right leg are resolving, but WBC continues to trend up. - Lactic acid negative - Continue clindamycin for a total of 10 days of antibiotics - Continue florastor for three months - X-ray of the right leg: No evidence of subcutaneous air - Duplex lower extremity: Negative for DVT - HIV NR and A1c 6.1  Diarrhea, side effect of clindamycin.  C. Diff negative.  Continue prn imodium.  Essential hypertension, blood pressure stable, continued losartan and verapamil  Elevated creatinine secondary to mild dehydration and sepsis, trended down with IV fluids.  Right breast abscess, once daily repacking with iodoform and dry dressing per RN.  Leukocytosis, due to infection.  Trending down at time of discharge  Vitamin B12 deficiency anemia. Given body habitus and no known diet restrictions, suspect pernicious anemia - Iron studies and folate wnl - TSH 2.961 - Vitamin B12 173 - Anti-IF and anti-parietal cells pending - Started b12 supplementation   Consultants: - none, however general surgery came by is a favor to the family to evaluate the right breast abscess area since the patient was admitted to the hospital.  Procedures:  Duplex right lower extremity on 6/17: Negative for DVT X-ray of the right tibia/fibula on 6/17: Negative for subcutaneous air   Antibiotics:  vanc 6/16 > 6/19  Zosyn 6/16 > 6/19  Clindamycin 6/16 >>  Discharge Exam: Filed Vitals:   07/21/14 0508  BP: 132/53  Pulse: 90  Temp: 98.7 F (37.1 C)  Resp: 18   Filed Vitals:   07/20/14 1440 07/20/14 1800 07/20/14 2127 07/21/14 2376  BP: 138/64  134/63 132/53  Pulse: 86  98 90  Temp: 98.6 F (37 C)  98.3 F (36.8 C) 98.7 F (37.1 C)  TempSrc: Oral  Oral Oral  Resp: 18  18 18   Height:  5\' 5"  (1.651 m)    Weight:  118.389 kg (261 lb)    SpO2: 100%  100% 98%     General: adult female, No acute distress  HEENT: NCAT, MMM  Cardiovascular: RRR, nl S1, S2 no mrg, 2+  pulses, warm extremities  Respiratory: CTAB, no increased WOB  Abdomen: NABS, soft, NT/ND  MSK: Normal tone and bulk, markedly improved erythema (less pink) that has receded from below knee. Skin is wrinkled and soft, no areas of necrotic discoloration. Less warm to touch and less tender. Second toe is almost back to normal size and color  Neuro: Grossly moves all extremities  Discharge Instructions      Discharge Instructions    Call MD for:  difficulty breathing, headache or visual disturbances    Complete by:  As directed      Call MD for:  extreme fatigue    Complete by:  As directed      Call MD for:  hives    Complete by:  As directed      Call MD for:  persistant dizziness or light-headedness    Complete by:  As directed      Call MD for:  persistant nausea and vomiting    Complete by:  As directed      Call MD for:  severe uncontrolled pain    Complete by:  As directed      Call MD for:  temperature >100.4    Complete by:  As directed      Diet - low sodium heart healthy    Complete by:  As directed      Discharge instructions    Complete by:  As directed   You were hospitalized with cellulitis.  Your cellulitis is improving with clindamycin, a powerful antibiotic.  Please continue clindamycin until all the pills are gone and follow up with your primary care doctor in one week for reassessment.  You will need repeat labs done at that time and some labs which are pending at the time of your discharge will need to be reviewed by your doctor.  You are at risk of infectious diarrhea called C.diff diarrhea which can occur anytime in the next three months.  If you develop diarrhea anytime in the next three months, please seek immediate medical attention.  Florastor is a probiotic which reduces the risk of C. Diff diarrhea.  Please take this for the next three months.  Finally, you were a little anemic and were found to have vitamin B12 deficiency.  Please start vitamin B12  supplements and have your doctor review the pending lab tests to find out why your  B12 levels are low.     Increase activity slowly    Complete by:  As directed             Medication List    TAKE these medications        clindamycin 300 MG capsule  Commonly known as:  CLEOCIN  Take 2 capsules (600 mg total) by mouth every 8 (eight) hours.     cyanocobalamin 1000 MCG tablet  Take 1 tablet (1,000 mcg total) by mouth daily.     ibuprofen 200 MG tablet  Commonly known as:  ADVIL,MOTRIN  Take 800 mg by mouth every 6 (six) hours as needed (pain).     losartan 100 MG tablet  Commonly known as:  COZAAR  TAKE 1 TABLET (100 MG TOTAL) BY MOUTH DAILY.     saccharomyces boulardii 250 MG capsule  Commonly known as:  FLORASTOR  Take 1 capsule (250 mg total) by mouth 2 (two) times daily.     verapamil 120 MG tablet  Commonly known as:  CALAN  TAKE 1 TABLET BY MOUTH TWICE A DAY     ZYRTEC ALLERGY 10 MG tablet  Generic drug:  cetirizine  Take 10 mg by mouth as needed for allergies.       Follow-up Information    Follow up with Unice Cobble, MD. Schedule an appointment as soon as possible for a visit in 1 week.   Specialty:  Internal Medicine   Contact information:   [address] [address] [phone]        The results of significant diagnostics from this hospitalization (including imaging, microbiology, ancillary and laboratory) are listed below for reference.    Significant Diagnostic Studies: Dg Tibia/fibula Right  07/18/2014   CLINICAL DATA:  Right leg swelling below the knee with redness, initial encounter  EXAM: RIGHT TIBIA AND FIBULA - 2 VIEW  COMPARISON:  None.  FINDINGS: Mild degenerative changes of the knee joint are seen. Diffuse soft tissue edema is noted consistent with the history. No acute fracture or dislocation is noted.  IMPRESSION: Soft tissue edema without acute bony abnormality.   Electronically Signed   By: Inez Catalina M.D.   On:  07/18/2014 09:50    Microbiology: Recent Results (from the past 240 hour(s))  Culture, blood (routine x 2)     Status: None (Preliminary result)   Collection Time: 07/18/14  2:20 AM  Result Value Ref Range Status   Specimen Description BLOOD RIGHT HAND  Final   Special Requests BOTTLES DRAWN AEROBIC ONLY 5CC  Final   Culture   Final    NO GROWTH 2 DAYS Performed at [facility]    Report Status PENDING  Incomplete  Culture, blood (routine x 2)     Status: None (Preliminary result)   Collection Time: 07/18/14  2:23 AM  Result Value Ref Range Status   Specimen Description BLOOD RIGHT ARM  Final   Special Requests BOTTLES DRAWN AEROBIC ONLY 5CC  Final   Culture   Final    NO GROWTH 2 DAYS Performed at [facility]    Report Status PENDING  Incomplete  Clostridium Difficile by PCR (not at [facility])     Status: None   Collection Time: 07/20/14  2:00 PM  Result Value Ref Range Status   C difficile by pcr NEGATIVE NEGATIVE Final     Labs: Basic Metabolic Panel:  Recent Labs Lab 07/17/14 2120 07/18/14 0220 07/19/14 0536 07/20/14 0520 07/21/14 0520  NA 141 139 141 143 141  K 4.0 3.9 3.7 3.9 3.8  CL 110 112* 113* 116* 111  CO2 21* 20* 21* 21* 23  GLUCOSE 92 102* 91 89 85  BUN 15 13 11 13 15   CREATININE 1.13* 0.96 1.06* 1.05* 1.16*  CALCIUM 10.2 9.5 9.0 9.2 9.4   Liver Function Tests:  Recent Labs Lab 07/18/14 0220  AST 25  ALT 31  ALKPHOS 71  BILITOT 0.9  PROT 7.4  ALBUMIN 3.4*   No results for input(s): LIPASE, AMYLASE in the last 168 hours. No results for input(s):  AMMONIA in the last 168 hours. CBC:  Recent Labs Lab 07/17/14 2120 07/18/14 0220 07/19/14 0536 07/20/14 0520 07/21/14 0520  WBC 12.5* 13.4* 13.5* 15.4* 14.4*  NEUTROABS 9.7*  --   --   --   --   HGB 12.0 11.0* 10.1* 10.3* 10.5*  HCT 36.2 33.2* 30.6* 31.8* 31.6*  MCV 87.0 85.3 86.7 86.9 87.3  PLT 251 215 249 282 310   Cardiac Enzymes: No results for input(s): CKTOTAL, CKMB,  CKMBINDEX, TROPONINI in the last 168 hours. BNP: BNP (last 3 results) No results for input(s): BNP in the last 8760 hours.  ProBNP (last 3 results) No results for input(s): PROBNP in the last 8760 hours.  CBG:  Recent Labs Lab 07/18/14 0715 07/19/14 0752 07/20/14 0717 07/21/14 0738  GLUCAP 155* 76 88 91    Time coordinating discharge: 35 minutes  Signed:  Janece Canterbury  [facility] 07/21/2014, 9:29 AM

## 2014-07-22 ENCOUNTER — Telehealth: Payer: Self-pay | Admitting: Internal Medicine

## 2014-07-22 LAB — INTRINSIC FACTOR ANTIBODIES: INTRINSIC FACTOR: 1.9 [AU]/ml — AB (ref 0.0–1.1)

## 2014-07-22 NOTE — Telephone Encounter (Signed)
Her anti-IF was positive so she will need to get injections of vitamin B12 by her PCP.

## 2014-07-22 NOTE — Telephone Encounter (Signed)
Explained that she will need vitamin B12 shots by her PCP to help treat her anemia and prevent lasting nerve damage.  She states she would talk to her primary care doctor about injections at her follow up appointment on Thursday.

## 2014-07-23 LAB — CULTURE, BLOOD (ROUTINE X 2)
Culture: NO GROWTH
Culture: NO GROWTH

## 2014-07-23 LAB — ANTI-PARIETAL ANTIBODY: PARIETAL CELL ANTIBODY-IGG: 4.8 U (ref 0.0–20.0)

## 2014-07-24 ENCOUNTER — Encounter: Payer: Self-pay | Admitting: Emergency Medicine

## 2014-07-24 ENCOUNTER — Other Ambulatory Visit (INDEPENDENT_AMBULATORY_CARE_PROVIDER_SITE_OTHER): Payer: 59

## 2014-07-24 ENCOUNTER — Ambulatory Visit (INDEPENDENT_AMBULATORY_CARE_PROVIDER_SITE_OTHER): Payer: 59 | Admitting: Internal Medicine

## 2014-07-24 VITALS — BP 150/90 | HR 106 | Temp 97.9°F | Resp 18 | Wt 265.0 lb

## 2014-07-24 DIAGNOSIS — I10 Essential (primary) hypertension: Secondary | ICD-10-CM | POA: Diagnosis not present

## 2014-07-24 DIAGNOSIS — D519 Vitamin B12 deficiency anemia, unspecified: Secondary | ICD-10-CM

## 2014-07-24 DIAGNOSIS — L03115 Cellulitis of right lower limb: Secondary | ICD-10-CM | POA: Diagnosis not present

## 2014-07-24 LAB — CBC WITH DIFFERENTIAL/PLATELET
BASOS PCT: 3.6 % — AB (ref 0.0–3.0)
Basophils Absolute: 0.5 10*3/uL — ABNORMAL HIGH (ref 0.0–0.1)
EOS ABS: 0.3 10*3/uL (ref 0.0–0.7)
EOS PCT: 2 % (ref 0.0–5.0)
HCT: 33.7 % — ABNORMAL LOW (ref 36.0–46.0)
Hemoglobin: 11.3 g/dL — ABNORMAL LOW (ref 12.0–15.0)
Lymphocytes Relative: 11.3 % — ABNORMAL LOW (ref 12.0–46.0)
Lymphs Abs: 1.6 10*3/uL (ref 0.7–4.0)
MCHC: 33.4 g/dL (ref 30.0–36.0)
MCV: 85.6 fl (ref 78.0–100.0)
MONOS PCT: 2.2 % — AB (ref 3.0–12.0)
Monocytes Absolute: 0.3 10*3/uL (ref 0.1–1.0)
NEUTROS ABS: 11.6 10*3/uL — AB (ref 1.4–7.7)
Neutrophils Relative %: 80.9 % — ABNORMAL HIGH (ref 43.0–77.0)
Platelets: 483 10*3/uL — ABNORMAL HIGH (ref 150.0–400.0)
RBC: 3.94 Mil/uL (ref 3.87–5.11)
RDW: 14.5 % (ref 11.5–15.5)
WBC: 14.4 10*3/uL — AB (ref 4.0–10.5)

## 2014-07-24 MED ORDER — METOPROLOL TARTRATE 25 MG PO TABS: 25.0000 mg | ORAL_TABLET | Freq: Two times a day (BID) | ORAL | Status: DC

## 2014-07-24 NOTE — Progress Notes (Signed)
Pre visit review using our clinic review tool, if applicable. No additional management support is needed unless otherwise documented below in the visit note. 

## 2014-07-24 NOTE — Patient Instructions (Addendum)
Wear over the calf support hose if you are standing for prolonged periods of  time or working on your feet for prolonged periods of time. Minimal Blood Pressure Goal= AVERAGE < 140/90;  Ideal is an AVERAGE < 135/85. This AVERAGE should be calculated from @ least 5-7 BP readings taken @ different times of day on different days of week. You should not respond to isolated BP readings , but rather the AVERAGE for that week .Please bring your  blood pressure cuff to office visits to verify that it is reliable.It  can also be checked against the blood pressure device at the pharmacy. Finger or wrist cuffs are not dependable; an arm cuff is.   Please remain out of work until seen in 5 days, 07/29/14.   Continue Florastor.

## 2014-07-24 NOTE — Progress Notes (Signed)
   Subjective:    Patient ID: Rebecca Foley, female    DOB: 01-04-1961, 54 y.o.   MRN: [id number]  HPI The hospital records 6/16-6/20/16 were reviewed. While hospitalized she was anticoagulated . This was not continued at discharge as there was no evidence of deep venous thrombosis. The cellulitis has responded to antibiotics , initially given IV. She's presently on clindamycin 300 milligrams 3 times a day. She has had loose stool but this is improving. A probiotic was recommended. She has no other GI symptoms.  Since discharge she has restricted her salt. The edema has improved. She has no constitutional or cardiopulmonary symptoms.  She is not monitoring her blood pressure.  She was found to have a low B12 level and supplementation was recommended.   Review of Systems Unexplained weight loss, abdominal pain, significant dyspepsia, dysphagia, melena, rectal bleeding, or persistently small caliber stools are denied.  Chest pain, palpitations, tachycardia, exertional dyspnea, paroxysmal nocturnal dyspnea, or claudication  are absent.      Objective:   Physical Exam Pertinent or positive findings include:  She exhibits a tachycardia with flow murmur. Repeat blood pressure was 150/90. The second heart sound is accentuated. Abdomen is protuberant. She has hyperpigmentation over the right shin. There is no evidence of cellulitis such as increased temperature or erythema. She has 1/2+ edema. Pedal pulses are decreased except for the left dorsalis pedis pulse.   General appearance :adequately nourished; in no distress.  Eyes: No conjunctival inflammation or scleral icterus is present.   Heart:   regular rhythm. S1 normal without gallop, murmur, click, rub or other extra sounds    Lungs:Chest clear to auscultation; no wheezes, rhonchi,rales ,or rubs present.No increased work of breathing.   Abdomen: bowel sounds normal, soft and non-tender without masses, organomegaly or hernias noted.   No guarding or rebound.   Vascular :  no bruits present.  Skin:Warm & dry.  Intact without suspicious lesions or rashes ; no tenting   Lymphatic: No lymphadenopathy is noted about the head, neck, axilla   Neuro: Strength, tone  normal.        Assessment & Plan:  #1 cellulitis right lower extremity, likely improving on clindamycin   #2 stool changes on the clindamycin. She should continue the probiotic   #3 tachycardia and suboptimally controlled blood pressure.  #4 low B12 level  Plan: See orders and recommendations. She will remain out of work and be rechecked in 5 days

## 2014-07-25 ENCOUNTER — Encounter: Payer: Self-pay | Admitting: Gastroenterology

## 2014-07-25 ENCOUNTER — Encounter: Payer: Self-pay | Admitting: Internal Medicine

## 2014-07-25 ENCOUNTER — Telehealth: Payer: Self-pay | Admitting: Internal Medicine

## 2014-07-25 DIAGNOSIS — I872 Venous insufficiency (chronic) (peripheral): Secondary | ICD-10-CM

## 2014-07-25 NOTE — Telephone Encounter (Signed)
Patient is calling to follow up on below call. She is trying to get compression socks RX corrected. Please call her back @ # listed

## 2014-07-25 NOTE — Telephone Encounter (Signed)
Melissa, an RN from [facility] wanted to find out about the order for socking compressions. Needs to be coded for peripheral vascular disease or for varicose veins in order to be covered. Please call her at [phone] ext [phone].

## 2014-07-25 NOTE — Telephone Encounter (Signed)
She has PVD ( venous insufficiency)

## 2014-07-28 LAB — METHYLMALONIC ACID, SERUM: Methylmalonic Acid, Quant: 133 nmol/L (ref 87–318)

## 2014-07-28 MED ORDER — MEDICAL COMPRESSION STOCKINGS MISC: Status: DC

## 2014-07-28 NOTE — Telephone Encounter (Signed)
rx printed

## 2014-07-29 ENCOUNTER — Encounter: Payer: Self-pay | Admitting: Internal Medicine

## 2014-07-29 ENCOUNTER — Encounter: Payer: Self-pay | Admitting: Emergency Medicine

## 2014-07-29 ENCOUNTER — Ambulatory Visit (INDEPENDENT_AMBULATORY_CARE_PROVIDER_SITE_OTHER): Payer: 59 | Admitting: Internal Medicine

## 2014-07-29 VITALS — BP 148/88 | HR 98 | Temp 98.7°F | Resp 18 | Wt 264.0 lb

## 2014-07-29 DIAGNOSIS — L03115 Cellulitis of right lower limb: Secondary | ICD-10-CM

## 2014-07-29 DIAGNOSIS — I872 Venous insufficiency (chronic) (peripheral): Secondary | ICD-10-CM

## 2014-07-29 DIAGNOSIS — I739 Peripheral vascular disease, unspecified: Secondary | ICD-10-CM

## 2014-07-29 DIAGNOSIS — I10 Essential (primary) hypertension: Secondary | ICD-10-CM | POA: Diagnosis not present

## 2014-07-29 MED ORDER — MEDICAL COMPRESSION STOCKINGS MISC: Status: DC

## 2014-07-29 NOTE — Progress Notes (Signed)
   Subjective:    Patient ID: Rebecca Foley, female    DOB: 1960-06-05, 54 y.o.   MRN: [id number]  HPI  The edema in the right lower extremity has improved with sodium restriction and over-the-counter support hose. Her blood pressure has improved as well and was 145/80+ the pharmacy.  She denies any active cardiopulmonary symptoms.  Review of Systems   Chest pain, palpitations, tachycardia, exertional dyspnea, paroxysmal nocturnal dyspnea, claudication or edema are absent.      Objective:   Physical Exam  Pertinent or positive findings include: The hyperpigmentation over the right lower leg is desquamating. The underlying skin appears healthy. Posterior tibial pulses are decreased. Pes planus are noted. Homans sign is negative  General appearance :adequately nourished; in no distress.  Eyes: No conjunctival inflammation or scleral icterus is present.  Oral exam:  Lips and gums are healthy appearing.There is no oropharyngeal erythema or exudate noted. Dental hygiene is good.  Heart:  Normal rate and regular rhythm. S1 and S2 normal without gallop, murmur, click, rub or other extra sounds    Lungs:Chest clear to auscultation; no wheezes, rhonchi,rales ,or rubs present.No increased work of breathing.   Vascular : all pulses equal ; no bruits present.  Skin:Warm & dry.  Intact without suspicious lesions or rashes ; no tenting or jaundice   Lymphatic: No lymphadenopathy is noted about the head, neck, axilla   Neuro: Strength & tone  normal.        Assessment & Plan:  #1 edema due to venous insufficiency right lower extremity is dramatically improved.  #2 cellulitis resolved completely  #3 hypertension, control improved  Plan: Released to work. She is to wear support hose when up on feet for extended periods

## 2014-07-29 NOTE — Progress Notes (Signed)
Pre visit review using our clinic review tool, if applicable. No additional management support is needed unless otherwise documented below in the visit note. 

## 2014-07-29 NOTE — Patient Instructions (Signed)
Wear over the calf support hose if you are standing for prolonged periods of  time or working on your feet for prolonged periods of time.  Minimal Blood Pressure Goal= AVERAGE < 140/90;  Ideal is an AVERAGE < 135/85. This AVERAGE should be calculated from @ least 5-7 BP readings taken @ different times of day on different days of week. You should not respond to isolated BP readings , but rather the AVERAGE for that week .Please bring your  blood pressure cuff to office visits to verify that it is reliable.It  can also be checked against the blood pressure device at the pharmacy. Finger or wrist cuffs are not dependable; an arm cuff is.

## 2014-08-26 ENCOUNTER — Other Ambulatory Visit: Payer: Self-pay | Admitting: Internal Medicine

## 2014-08-27 ENCOUNTER — Other Ambulatory Visit: Payer: Self-pay | Admitting: Emergency Medicine

## 2014-08-27 MED ORDER — CYANOCOBALAMIN 1000 MCG PO TABS: 1000.0000 ug | ORAL_TABLET | Freq: Every day | ORAL | Status: DC

## 2014-08-28 ENCOUNTER — Other Ambulatory Visit: Payer: Self-pay | Admitting: Internal Medicine

## 2014-08-29 ENCOUNTER — Other Ambulatory Visit: Payer: Self-pay | Admitting: Internal Medicine

## 2014-10-17 ENCOUNTER — Other Ambulatory Visit: Payer: Self-pay | Admitting: Internal Medicine

## 2014-10-23 ENCOUNTER — Other Ambulatory Visit: Payer: Self-pay | Admitting: Internal Medicine

## 2014-10-24 ENCOUNTER — Other Ambulatory Visit: Payer: Self-pay | Admitting: Internal Medicine

## 2014-12-16 ENCOUNTER — Other Ambulatory Visit: Payer: Self-pay | Admitting: General Surgery

## 2014-12-16 DIAGNOSIS — N611 Abscess of the breast and nipple: Secondary | ICD-10-CM

## 2014-12-18 ENCOUNTER — Other Ambulatory Visit: Payer: 59

## 2014-12-29 ENCOUNTER — Ambulatory Visit
Admission: RE | Admit: 2014-12-29 | Discharge: 2014-12-29 | Disposition: A | Discharge status: Home / Self Care | Payer: 59 | Source: Ambulatory Visit | Attending: General Surgery | Admitting: General Surgery

## 2014-12-29 DIAGNOSIS — N611 Abscess of the breast and nipple: Secondary | ICD-10-CM

## 2014-12-30 ENCOUNTER — Other Ambulatory Visit: Payer: 59

## 2015-01-01 ENCOUNTER — Other Ambulatory Visit: Payer: Self-pay

## 2015-01-01 MED ORDER — METOPROLOL TARTRATE 25 MG PO TABS: 25.0000 mg | ORAL_TABLET | Freq: Two times a day (BID) | ORAL | Status: DC

## 2015-01-01 MED ORDER — LOSARTAN POTASSIUM 100 MG PO TABS: 100.0000 mg | ORAL_TABLET | Freq: Every day | ORAL | Status: DC

## 2015-01-01 MED ORDER — VERAPAMIL HCL 120 MG PO TABS: 120.0000 mg | ORAL_TABLET | Freq: Two times a day (BID) | ORAL | Status: DC

## 2015-03-19 ENCOUNTER — Other Ambulatory Visit: Payer: Self-pay | Admitting: Internal Medicine

## 2015-03-19 NOTE — Telephone Encounter (Signed)
LVM informing pt to call back and schedule appt with new PCP to have RX filled

## 2015-05-27 ENCOUNTER — Telehealth: Payer: Self-pay | Admitting: *Deleted

## 2015-05-27 ENCOUNTER — Telehealth: Payer: Self-pay | Admitting: Internal Medicine

## 2015-05-27 MED ORDER — LOSARTAN POTASSIUM 100 MG PO TABS: 100.0000 mg | ORAL_TABLET | Freq: Every day | ORAL | Status: DC

## 2015-05-27 NOTE — Telephone Encounter (Signed)
Pt has appt schedule to see Dr. Quay Burow 07/22/15 sent enough refills until appt...Johny Chess

## 2015-05-27 NOTE — Telephone Encounter (Signed)
Called and spoke to pt about establishing with a new pcp. Pt has an appt with Dr. Quay Burow on 07/22/15 at 8:30am.

## 2015-06-01 ENCOUNTER — Other Ambulatory Visit: Payer: Self-pay

## 2015-06-01 MED ORDER — CYANOCOBALAMIN 1000 MCG PO TABS: 1000.0000 ug | ORAL_TABLET | Freq: Every day | ORAL | Status: DC

## 2015-07-22 ENCOUNTER — Other Ambulatory Visit (INDEPENDENT_AMBULATORY_CARE_PROVIDER_SITE_OTHER): Payer: 59

## 2015-07-22 ENCOUNTER — Encounter: Payer: Self-pay | Admitting: Internal Medicine

## 2015-07-22 ENCOUNTER — Ambulatory Visit (INDEPENDENT_AMBULATORY_CARE_PROVIDER_SITE_OTHER): Payer: 59 | Admitting: Internal Medicine

## 2015-07-22 VITALS — BP 148/86 | HR 89 | Temp 99.0°F | Resp 16 | Wt 283.0 lb

## 2015-07-22 DIAGNOSIS — D519 Vitamin B12 deficiency anemia, unspecified: Secondary | ICD-10-CM

## 2015-07-22 DIAGNOSIS — E669 Obesity, unspecified: Secondary | ICD-10-CM

## 2015-07-22 DIAGNOSIS — I10 Essential (primary) hypertension: Secondary | ICD-10-CM

## 2015-07-22 DIAGNOSIS — Z1159 Encounter for screening for other viral diseases: Secondary | ICD-10-CM

## 2015-07-22 DIAGNOSIS — E119 Type 2 diabetes mellitus without complications: Secondary | ICD-10-CM | POA: Insufficient documentation

## 2015-07-22 DIAGNOSIS — R7303 Prediabetes: Secondary | ICD-10-CM

## 2015-07-22 DIAGNOSIS — M25561 Pain in right knee: Secondary | ICD-10-CM

## 2015-07-22 DIAGNOSIS — E1169 Type 2 diabetes mellitus with other specified complication: Secondary | ICD-10-CM | POA: Insufficient documentation

## 2015-07-22 DIAGNOSIS — D649 Anemia, unspecified: Secondary | ICD-10-CM

## 2015-07-22 LAB — HEMOGLOBIN A1C: HEMOGLOBIN A1C: 6 % (ref 4.6–6.5)

## 2015-07-22 LAB — COMPREHENSIVE METABOLIC PANEL
ALBUMIN: 4.2 g/dL (ref 3.5–5.2)
ALT: 29 U/L (ref 0–35)
AST: 19 U/L (ref 0–37)
Alkaline Phosphatase: 65 U/L (ref 39–117)
BILIRUBIN TOTAL: 0.4 mg/dL (ref 0.2–1.2)
BUN: 12 mg/dL (ref 6–23)
CALCIUM: 10.3 mg/dL (ref 8.4–10.5)
CO2: 27 mEq/L (ref 19–32)
CREATININE: 0.79 mg/dL (ref 0.40–1.20)
Chloride: 105 mEq/L (ref 96–112)
GFR: 96.41 mL/min (ref >60.00)
Glucose, Bld: 113 mg/dL — ABNORMAL HIGH (ref 70–99)
Potassium: 4.4 mEq/L (ref 3.5–5.1)
SODIUM: 138 meq/L (ref 135–145)
TOTAL PROTEIN: 7.6 g/dL (ref 6.0–8.3)

## 2015-07-22 LAB — CBC WITH DIFFERENTIAL/PLATELET
BASOS ABS: 0 10*3/uL (ref 0.0–0.1)
BASOS PCT: 0.5 % (ref 0.0–3.0)
EOS ABS: 0.2 10*3/uL (ref 0.0–0.7)
Eosinophils Relative: 3.1 % (ref 0.0–5.0)
HEMATOCRIT: 38 % (ref 36.0–46.0)
HEMOGLOBIN: 12.8 g/dL (ref 12.0–15.0)
LYMPHS PCT: 24.2 % (ref 12.0–46.0)
Lymphs Abs: 1.9 10*3/uL (ref 0.7–4.0)
MCHC: 33.7 g/dL (ref 30.0–36.0)
MCV: 82.6 fl (ref 78.0–100.0)
MONO ABS: 0.6 10*3/uL (ref 0.1–1.0)
Monocytes Relative: 7.4 % (ref 3.0–12.0)
Neutro Abs: 5 10*3/uL (ref 1.4–7.7)
Neutrophils Relative %: 64.8 % (ref 43.0–77.0)
PLATELETS: 344 10*3/uL (ref 150.0–400.0)
RBC: 4.6 Mil/uL (ref 3.87–5.11)
RDW: 15.6 % — AB (ref 11.5–15.5)
WBC: 7.8 10*3/uL (ref 4.0–10.5)

## 2015-07-22 LAB — TSH: TSH: 2.26 u[IU]/mL (ref 0.35–4.50)

## 2015-07-22 LAB — LIPID PANEL
CHOLESTEROL: 139 mg/dL (ref 0–200)
HDL: 42.8 mg/dL (ref >39.00)
LDL Cholesterol: 77 mg/dL (ref 0–99)
NONHDL: 96.06
Total CHOL/HDL Ratio: 3
Triglycerides: 93 mg/dL (ref 0.0–149.0)
VLDL: 18.6 mg/dL (ref 0.0–40.0)

## 2015-07-22 LAB — VITAMIN B12: VITAMIN B 12: 484 pg/mL (ref 211–911)

## 2015-07-22 MED ORDER — METOPROLOL SUCCINATE ER 25 MG PO TB24: 25.0000 mg | ORAL_TABLET | Freq: Every day | ORAL | Status: DC

## 2015-07-22 MED ORDER — VERAPAMIL HCL ER 120 MG PO TBCR: 120.0000 mg | EXTENDED_RELEASE_TABLET | Freq: Every day | ORAL | Status: DC

## 2015-07-22 MED ORDER — LOSARTAN POTASSIUM 100 MG PO TABS: 100.0000 mg | ORAL_TABLET | Freq: Every day | ORAL | Status: DC

## 2015-07-22 NOTE — Progress Notes (Signed)
Pre visit review using our clinic review tool, if applicable. No additional management support is needed unless otherwise documented below in the visit note. 

## 2015-07-22 NOTE — Assessment & Plan Note (Signed)
Check cbc 

## 2015-07-22 NOTE — Assessment & Plan Note (Signed)
a1c

## 2015-07-22 NOTE — Assessment & Plan Note (Signed)
Not ideally controlled, concern for CKD Change metoprolol and verapamil to once daily to improve compliance Stressed low sodium diet Work on weight loss F/u in 1 month

## 2015-07-22 NOTE — Progress Notes (Signed)
Subjective:    Patient ID: Rebecca Foley, female    DOB: [birth year], 55 y.o.   MRN: [id number]  HPI She is here to establish with a new pcp.  She is here for follow up.  Hypertension: She is taking her medication daily, but forgets the second dose of her medications 1-2 times a week.  She is fairly compliant with a low sodium diet.  She denies chest pain, palpitations, edema, shortness of breath and regular headaches. She is exercising regularly.  She does monitor her blood pressure at home, around 140/90's, at work it is about the same.    B12 deficiency:  She is taking vitamin B12  2-3 times week.    Prediabetes:  She has a history of prediabetes.  She could do better with her diet.  She is not currently exercising due to knee pain.  She knows she needs to lose weight.   Right knee pain:  She has had knee pain for about two months.  She took a long step off a step and landed funny at that time, but it did not hurt immediately after.  She denies any other injury.  The pain is on the medical aspect of the knee.  The knee feels swollen, but does not looks swollen.  The knee is less reliable - she feels it is weaker especially when going down stairs.    Obesity: She had just started exercising prior to the knee pain.  She is not able to exercise now.    Medications and allergies reviewed with patient and updated if appropriate.  Patient Active Problem List   Diagnosis Date Noted  . Obesity 07/22/2015  . Prediabetes 07/22/2015  . Vitamin B12 deficiency anemia 07/21/2014  . Cellulitis of right lower leg 07/17/2014  . Hyperglycemia 01/08/2014  . Perennial allergic rhinitis with seasonal variation 07/08/2009  . COLONIC POLYPS 07/03/2007  . ANEMIA-NOS 07/03/2007  . KERATOCONUS 07/03/2007  . Essential hypertension 07/03/2007    Current Outpatient Prescriptions on File Prior to Visit  Medication Sig Dispense Refill  . cetirizine (ZYRTEC ALLERGY) 10 MG tablet Take 10 mg by mouth as  needed for allergies.     . cyanocobalamin 1000 MCG tablet Take 1 tablet (1,000 mcg total) by mouth daily. 30 tablet 2  . ibuprofen (ADVIL,MOTRIN) 200 MG tablet Take 800 mg by mouth every 6 (six) hours as needed (pain).     No current facility-administered medications on file prior to visit.    Past Medical History  Diagnosis Date  . Allergy     perennial  . Hypertension   . Anemia     heavy menses  . Nonspecific elevation of levels of transaminase or lactic acid dehydrogenase (LDH) 2011    ALT 43  . Impaired fasting glucose 2011    108    Past Surgical History  Procedure Laterality Date  . Cesarean section    . Thumb sugery      trigger  . Colonoscopy  2009 & 2011    polyps 2009; negative 2011  . Dilation and curettage of uterus    . Incision and drainage of left breast  12/2013  . Tubal ligation    . Incision and drainage abscess Right 06/17/2014    Procedure: INCISION AND DRAINAGE DEBRIDEMENT OF CHRONIC RIGH BREAST ABSCESS;  Surgeon: Greer Pickerel, MD;  Location: [facility];  Service: General;  Laterality: Right;    Social History   Social History  . Marital Status: Married  Spouse Name: N/A  . Number of Children: N/A  . Years of Education: N/A   Social History Main Topics  . Smoking status: Never Smoker   . Smokeless tobacco: Never Used  . Alcohol Use: No  . Drug Use: No  . Sexual Activity: Not on file   Other Topics Concern  . Not on file   Social History Narrative    Family History  Problem Relation Age of Onset  . Hypertension Mother   . Hypertension Father   . Stroke Father 10  . Heart disease Father     CM  . Alcohol abuse Brother   . Cancer Brother     ? prostate; PMH of Agent Orange exposure in [place]  . Heart attack Neg Hx     Review of Systems  Constitutional: Negative for fever and chills.  Respiratory: Negative for cough, shortness of breath and wheezing.   Cardiovascular: Negative for chest pain, palpitations and leg swelling.    Gastrointestinal: Negative for nausea and abdominal pain.       No gerd  Neurological: Negative for dizziness, light-headedness and headaches.  Psychiatric/Behavioral: Negative for dysphoric mood. The patient is not nervous/anxious.        Objective:   Filed Vitals:   07/22/15 0823  BP: 148/86  Pulse: 89  Temp: 99 F (37.2 C)  Resp: 16   Filed Weights   07/22/15 0823  Weight: 283 lb (128.368 kg)   Body mass index is 47.09 kg/(m^2).   Physical Exam Constitutional: Appears well-developed and well-nourished. No distress.  Neck: Neck supple. No tracheal deviation present. No thyromegaly present.  No carotid bruit. No cervical adenopathy.   Cardiovascular: Normal rate, regular rhythm and normal heart sounds.   1/6 systolic murmur heard.  No edema Pulmonary/Chest: Effort normal and breath sounds normal. No respiratory distress. No wheezes.  Msk: right knee without obvious swelling, no tenderness with palpation, but pain on medical aspect with extension       Assessment & Plan:   See Problem List for Assessment and Plan of chronic medical problems.  F/u in one month - htn and review blood work

## 2015-07-22 NOTE — Patient Instructions (Addendum)
  Test(s) ordered today. Your results will be released to [facility] (or called to you) after review, usually within 72hours after test completion. If any changes need to be made, you will be notified at that same time.  All other Health Maintenance issues reviewed.   All recommended immunizations and age-appropriate screenings are up-to-date or discussed.  No immunizations administered today.   Medications reviewed and updated.  Changes include making two of your blood pressure medications once  Day.    Your prescription(s) have been submitted to your pharmacy. Please take as directed and contact our office if you believe you are having problem(s) with the medication(s).  A referral was ordered for a nutritionist.  Please followup in 1 month

## 2015-07-22 NOTE — Assessment & Plan Note (Addendum)
Check a1c Refer to nutrition Stressed low sugar/carb diet, exercise, weight loss

## 2015-07-22 NOTE — Assessment & Plan Note (Signed)
Stressed weight loss Will refer to sports medicine for knee - so she can start exercising again Stressed decreased portions Referred to nutrition

## 2015-07-22 NOTE — Assessment & Plan Note (Signed)
Taking B12  2-3 times a week

## 2015-07-23 ENCOUNTER — Encounter: Payer: Self-pay | Admitting: Internal Medicine

## 2015-07-23 LAB — HEPATITIS C ANTIBODY: HCV Ab: NEGATIVE

## 2015-08-12 ENCOUNTER — Other Ambulatory Visit: Payer: Self-pay | Admitting: *Deleted

## 2015-08-12 MED ORDER — LOSARTAN POTASSIUM 100 MG PO TABS: 100.0000 mg | ORAL_TABLET | Freq: Every day | ORAL | Status: DC

## 2015-08-13 ENCOUNTER — Ambulatory Visit (INDEPENDENT_AMBULATORY_CARE_PROVIDER_SITE_OTHER): Payer: 59 | Admitting: Family Medicine

## 2015-08-13 ENCOUNTER — Other Ambulatory Visit: Payer: Self-pay

## 2015-08-13 ENCOUNTER — Encounter: Payer: Self-pay | Admitting: Family Medicine

## 2015-08-13 ENCOUNTER — Ambulatory Visit (INDEPENDENT_AMBULATORY_CARE_PROVIDER_SITE_OTHER)
Admission: RE | Admit: 2015-08-13 | Discharge: 2015-08-13 | Disposition: A | Discharge status: Home / Self Care | Payer: 59 | Source: Ambulatory Visit | Attending: Family Medicine | Admitting: Family Medicine

## 2015-08-13 VITALS — BP 142/84 | HR 97 | Ht 65.0 in | Wt 286.0 lb

## 2015-08-13 DIAGNOSIS — S83206A Unspecified tear of unspecified meniscus, current injury, right knee, initial encounter: Secondary | ICD-10-CM | POA: Diagnosis not present

## 2015-08-13 DIAGNOSIS — M25561 Pain in right knee: Secondary | ICD-10-CM | POA: Diagnosis not present

## 2015-08-13 DIAGNOSIS — M2141 Flat foot [pes planus] (acquired), right foot: Secondary | ICD-10-CM | POA: Diagnosis not present

## 2015-08-13 DIAGNOSIS — M2351 Chronic instability of knee, right knee: Secondary | ICD-10-CM | POA: Diagnosis not present

## 2015-08-13 DIAGNOSIS — M2142 Flat foot [pes planus] (acquired), left foot: Secondary | ICD-10-CM

## 2015-08-13 MED ORDER — DICLOFENAC SODIUM 2 % TD SOLN: 2.0000 "application " | Freq: Two times a day (BID) | TRANSDERMAL | Status: DC

## 2015-08-13 NOTE — Progress Notes (Signed)
Pre visit review using our clinic review tool, if applicable. No additional management support is needed unless otherwise documented below in the visit note. 

## 2015-08-13 NOTE — Assessment & Plan Note (Signed)
I do believe the patient has more of an acute meniscal tear. Patient does have some underlying osteophytic changes that could be contributing as well.. Meniscal cyst noted as well. We discussed icing regimen. Given trial of topical anti-inflammatories and prescription. Work with Product/process development scientist to learn home exercises in greater detail. We discussed which activities to do in which ones to avoid. Patient will come back and see me again in 4 weeks for further evaluation. Patient continues to have some instability we will need to consider advanced imaging.

## 2015-08-13 NOTE — Patient Instructions (Addendum)
Good to see you.  Xray today downstairs.  Ice 20 minutes 2 times daily. Usually after activity and before bed. Exercises 3 times a week.  pennsaid pinkie amount topically 2 times daily as needed.  Spenco orthotics "total support" online would be great Keep your feet in alignment.  See me again in 3-4 weeks

## 2015-08-13 NOTE — Progress Notes (Signed)
[name] [address], [place] [address] Phone: [phone] Subjective:    I'm seeing this patient by the request  of:  Binnie Rail, MD   CC: right knee pain   [id number] Rebecca Foley is a 55 y.o. female coming in with complaint of Right knee pain. Seems to be more on the medial aspect of the knee. Has had it for approximate 3 months. Patient states and there was a time where she seems to take a step off of the lateral but didn't have pain immediately. Seem to be more of a gradual onset. Patient states sometimes it feels like it is swollen. Worse with going downstairs and does feel some instability at times. Denies radiation. Denies numbness. Rates the severity of pain a 7 out of 10.     Past Medical History  Diagnosis Date  . Allergy     perennial  . Hypertension   . Anemia     heavy menses  . Nonspecific elevation of levels of transaminase or lactic acid dehydrogenase (LDH) 2011    ALT 43  . Impaired fasting glucose 2011    108   Past Surgical History  Procedure Laterality Date  . Cesarean section    . Thumb sugery      trigger  . Colonoscopy  2009 & 2011    polyps 2009; negative 2011  . Dilation and curettage of uterus    . Incision and drainage of left breast  12/2013  . Tubal ligation    . Incision and drainage abscess Right 06/17/2014    Procedure: INCISION AND DRAINAGE DEBRIDEMENT OF CHRONIC RIGH BREAST ABSCESS;  Surgeon: Greer Pickerel, MD;  Location: [facility];  Service: General;  Laterality: Right;   Social History   Social History  . Marital Status: Married    Spouse Name: N/A  . Number of Children: N/A  . Years of Education: N/A   Social History Main Topics  . Smoking status: Never Smoker   . Smokeless tobacco: Never Used  . Alcohol Use: No  . Drug Use: No  . Sexual Activity: Not Asked   Other Topics Concern  . None   Social History Narrative   No Known Allergies Family History  Problem Relation Age  of Onset  . Hypertension Mother   . Hypertension Father   . Stroke Father 60  . Heart disease Father     CM  . Alcohol abuse Brother   . Cancer Brother     ? prostate; PMH of Agent Orange exposure in [place]  . Heart attack Neg Hx     Past medical history, social, surgical and family history all reviewed in electronic medical record.  No pertanent information unless stated regarding to the chief complaint.   Review of Systems: No headache, visual changes, nausea, vomiting, diarrhea, constipation, dizziness, abdominal pain, skin rash, fevers, chills, night sweats, weight loss, swollen lymph nodes, body aches, joint swelling, muscle aches, chest pain, shortness of breath, mood changes.   Objective Blood pressure 142/84, pulse 97, height 5\' 5"  (1.651 m), weight 286 lb (129.729 kg), SpO2 99 %.  General: No apparent distress alert and oriented x3 mood and affect normal, dressed appropriately.  HEENT: Pupils equal, extraocular movements intact  Respiratory: Patient's speak in full sentences and does not appear short of breath  Cardiovascular: No lower extremity edema, non tender, no erythema  Skin: Warm dry intact with no signs of infection or rash on extremities or  on axial skeleton.  Abdomen: Soft nontender  Neuro: Cranial nerves II through XII are intact, neurovascularly intact in all extremities with 2+ DTRs and 2+ pulses.  Lymph: No lymphadenopathy of posterior or anterior cervical chain or axillae bilaterally.  Gait normal with good balance and coordination.  MSK:  Non tender with full range of motion and good stability and symmetric strength and tone of shoulders, elbows, wrist, hip, and ankles bilaterally.   Knee: Right Normal to inspection with no erythema or effusion or obvious bony abnormalities. Tender over the medial joint line ROM full in flexion and extension and lower leg rotation. Ligaments with solid consistent endpoints including ACL, PCL, LCL, the patient does have mild  laxity of the MCL. Positive Mcmurray's, Apley's, and Thessalonian tests. Non painful patellar compression. Patellar glide with mild crepitus. Patellar and quadriceps tendons unremarkable. Hamstring and quadriceps strength is normal.   Foot exam shows the patient does have significant pes planus bilaterally  MSK US performed of: Right knee This study was ordered, performed, and interpreted by Charlann Boxer D.O.  Knee: Difficult scan secondary to patient's body habitus Impression anterior medial meniscus we are unable to appreciate a true tear but patient does have overlying hypoechoic changes as likely a. Meniscal cyst. No abnormality of prepatellar bursa. LCL and MCL unremarkable on long and transverse views.  IMPRESSION: Likely medial meniscal tear with. Meniscal cyst  Procedure: Real-time Ultrasound Guided Injection of right knee Device: GE Logiq E  Ultrasound guided injection is preferred based studies that show increased duration, increased effect, greater accuracy, decreased procedural pain, increased response rate, and decreased cost with ultrasound guided versus blind injection.  Verbal informed consent obtained.  Time-out conducted.  Noted no overlying erythema, induration, or other signs of local infection.  Skin prepped in a sterile fashion.  Local anesthesia: Topical Ethyl chloride.  With sterile technique and under real time ultrasound guidance: With a 22-gauge 2 inch needle patient was injected with 4 cc of 0.5% Marcaine and 1 cc of Kenalog 40 mg/dL. This was from a superior lateral approach.  Completed without difficulty  Pain immediately resolved suggesting accurate placement of the medication.  Advised to call if fevers/chills, erythema, induration, drainage, or persistent bleeding.  Images permanently stored and available for review in the ultrasound unit.  Impression: Technically successful ultrasound guided injection.     Impression and Recommendations:      This case required medical decision making of moderate complexity.      Note: This dictation was prepared with Dragon dictation along with smaller phrase technology. Any transcriptional errors that result from this process are unintentional.

## 2015-09-02 ENCOUNTER — Ambulatory Visit: Payer: 59 | Admitting: Internal Medicine

## 2015-09-07 ENCOUNTER — Ambulatory Visit: Payer: 59 | Admitting: Family Medicine

## 2015-11-24 ENCOUNTER — Other Ambulatory Visit: Payer: Self-pay | Admitting: *Deleted

## 2015-11-24 MED ORDER — DICLOFENAC SODIUM 2 % TD SOLN: 2.0000 "application " | Freq: Two times a day (BID) | TRANSDERMAL | 3 refills | Status: DC

## 2016-07-28 ENCOUNTER — Other Ambulatory Visit: Payer: Self-pay | Admitting: Internal Medicine

## 2016-08-21 ENCOUNTER — Other Ambulatory Visit: Payer: Self-pay | Admitting: Internal Medicine

## 2016-10-27 ENCOUNTER — Other Ambulatory Visit: Payer: Self-pay | Admitting: Internal Medicine

## 2016-11-11 ENCOUNTER — Other Ambulatory Visit: Payer: Self-pay | Admitting: Internal Medicine

## 2017-06-14 ENCOUNTER — Other Ambulatory Visit: Payer: Self-pay | Admitting: Internal Medicine

## 2017-06-14 MED ORDER — METOPROLOL SUCCINATE ER 25 MG PO TB24: 25.0000 mg | ORAL_TABLET | Freq: Every day | ORAL | 0 refills | Status: DC

## 2017-06-14 MED ORDER — VERAPAMIL HCL ER 120 MG PO TBCR: 120.0000 mg | EXTENDED_RELEASE_TABLET | Freq: Every day | ORAL | 0 refills | Status: DC

## 2017-06-14 NOTE — Telephone Encounter (Signed)
Medications filled x 15 days until appointment on 06/20/17  Losartan refill  Last OV:07/22/15 (Upcoming appointment 06/20/17)  Last refilled:08/12/15 (expired prescription) T[facility] Pharmacy: [id number] - [place], [place] [phone] (Phone) [phone] (Fax)

## 2017-06-14 NOTE — Telephone Encounter (Signed)
Copied from [place] [phone] [id number]. Topic: Quick Communication - Rx Refill/Question >> Jun 14, 2017  4:07 PM Margot Ables wrote: Medication: losartan, metoprolol, verapamil - scheduled OV for 06/20/17 Has the patient contacted their pharmacy? Yes - she states refill was declined Preferred Pharmacy (with phone number or street name): [id number] - [place], [place] [phone] (Phone) [phone] (Fax)

## 2017-06-15 MED ORDER — LOSARTAN POTASSIUM 100 MG PO TABS: 100.0000 mg | ORAL_TABLET | Freq: Every day | ORAL | 0 refills | Status: DC

## 2017-06-20 ENCOUNTER — Other Ambulatory Visit (INDEPENDENT_AMBULATORY_CARE_PROVIDER_SITE_OTHER): Payer: 59

## 2017-06-20 ENCOUNTER — Encounter: Payer: Self-pay | Admitting: Internal Medicine

## 2017-06-20 ENCOUNTER — Ambulatory Visit: Payer: 59 | Admitting: Internal Medicine

## 2017-06-20 VITALS — BP 212/80 | HR 75 | Temp 98.6°F | Resp 16 | Ht 65.0 in | Wt 300.0 lb

## 2017-06-20 DIAGNOSIS — R7303 Prediabetes: Secondary | ICD-10-CM

## 2017-06-20 DIAGNOSIS — Z6841 Body Mass Index (BMI) 40.0 and over, adult: Secondary | ICD-10-CM

## 2017-06-20 DIAGNOSIS — I10 Essential (primary) hypertension: Secondary | ICD-10-CM

## 2017-06-20 DIAGNOSIS — D519 Vitamin B12 deficiency anemia, unspecified: Secondary | ICD-10-CM

## 2017-06-20 LAB — LIPID PANEL
CHOL/HDL RATIO: 3
Cholesterol: 130 mg/dL (ref 0–200)
HDL: 41.5 mg/dL (ref >39.00)
LDL Cholesterol: 71 mg/dL (ref 0–99)
NonHDL: 88.93
TRIGLYCERIDES: 89 mg/dL (ref 0.0–149.0)
VLDL: 17.8 mg/dL (ref 0.0–40.0)

## 2017-06-20 LAB — CBC WITH DIFFERENTIAL/PLATELET
BASOS ABS: 0.1 10*3/uL (ref 0.0–0.1)
Basophils Relative: 1.2 % (ref 0.0–3.0)
EOS ABS: 0.2 10*3/uL (ref 0.0–0.7)
Eosinophils Relative: 2.3 % (ref 0.0–5.0)
HEMATOCRIT: 35.5 % — AB (ref 36.0–46.0)
HEMOGLOBIN: 11.8 g/dL — AB (ref 12.0–15.0)
LYMPHS PCT: 24.4 % (ref 12.0–46.0)
Lymphs Abs: 1.9 10*3/uL (ref 0.7–4.0)
MCHC: 33.2 g/dL (ref 30.0–36.0)
MCV: 83.6 fl (ref 78.0–100.0)
Monocytes Absolute: 0.7 10*3/uL (ref 0.1–1.0)
Monocytes Relative: 9.3 % (ref 3.0–12.0)
Neutro Abs: 4.9 10*3/uL (ref 1.4–7.7)
Neutrophils Relative %: 62.8 % (ref 43.0–77.0)
Platelets: 294 10*3/uL (ref 150.0–400.0)
RBC: 4.25 Mil/uL (ref 3.87–5.11)
RDW: 16 % — ABNORMAL HIGH (ref 11.5–15.5)
WBC: 7.9 10*3/uL (ref 4.0–10.5)

## 2017-06-20 LAB — COMPREHENSIVE METABOLIC PANEL
ALBUMIN: 3.9 g/dL (ref 3.5–5.2)
ALT: 46 U/L — ABNORMAL HIGH (ref 0–35)
AST: 30 U/L (ref 0–37)
Alkaline Phosphatase: 74 U/L (ref 39–117)
BUN: 10 mg/dL (ref 6–23)
CHLORIDE: 107 meq/L (ref 96–112)
CO2: 26 meq/L (ref 19–32)
CREATININE: 0.68 mg/dL (ref 0.40–1.20)
Calcium: 9.9 mg/dL (ref 8.4–10.5)
GFR: 113.86 mL/min (ref >60.00)
GLUCOSE: 105 mg/dL — AB (ref 70–99)
Potassium: 4 mEq/L (ref 3.5–5.1)
SODIUM: 139 meq/L (ref 135–145)
Total Bilirubin: 0.3 mg/dL (ref 0.2–1.2)
Total Protein: 7.4 g/dL (ref 6.0–8.3)

## 2017-06-20 LAB — HEMOGLOBIN A1C: HEMOGLOBIN A1C: 6.1 % (ref 4.6–6.5)

## 2017-06-20 LAB — TSH: TSH: 2.65 u[IU]/mL (ref 0.35–4.50)

## 2017-06-20 MED ORDER — OLMESARTAN MEDOXOMIL-HCTZ 40-25 MG PO TABS: 1.0000 | ORAL_TABLET | Freq: Every day | ORAL | 5 refills | Status: DC

## 2017-06-20 MED ORDER — VERAPAMIL HCL ER 120 MG PO TBCR: 120.0000 mg | EXTENDED_RELEASE_TABLET | Freq: Every day | ORAL | 5 refills | Status: DC

## 2017-06-20 MED ORDER — METOPROLOL SUCCINATE ER 25 MG PO TB24: 25.0000 mg | ORAL_TABLET | Freq: Every day | ORAL | 5 refills | Status: DC

## 2017-06-20 NOTE — Assessment & Plan Note (Signed)
Not currently taking B12 Will likely need to restart I did not order B12 today, but will order in the near future Check CBC

## 2017-06-20 NOTE — Assessment & Plan Note (Signed)
Blood pressure uncontrolled Noncompliant with medication-she did restart her medication, but has only been taking it for a few days She likely has developed resistant hypertension Continue verapamil 120 daily Continue metoprolol 25 XL daily We will change losartan to Benicar-hydrochlorothiazide 40-25 mg daily Stressed low-sodium diet Will start regular exercise and work on weight loss Check CMP, CBC, TSH, lipid panel Follow-up 3-4 weeks

## 2017-06-20 NOTE — Patient Instructions (Addendum)
  Test(s) ordered today. Your results will be released to [facility] (or called to you) after review, usually within 72hours after test completion. If any changes need to be made, you will be notified at that same time.   Medications reviewed and updated.  Changes include continue your metoprolol and verapamil.  We will change the losartan to olmesartan-hctz daily.   Your prescription(s) have been submitted to your pharmacy. Please take as directed and contact our office if you believe you are having problem(s) with the medication(s).  A referral was ordered for nutrition   Please followup in 3-4 months

## 2017-06-20 NOTE — Assessment & Plan Note (Signed)
Morbid obesity with uncontrolled hypertension, prediabetes She is very upset by her weight and understands that she needs to lose weight and knows what she needs to do.  She has lost weight in the past Will refer to nutrition She will start walking on a regular basis She will revise her diet-start bringing her lunch to work, decrease portions and eat healthier overall Discussed weight management clinic, but at this point she is not interested in this She wondered about weight loss medication-we will see if her sugars are in the diabetic range and will consider medication that may help with this, but can still consider metformin.  Would avoid any other weight loss medication at this time given her uncontrolled high blood pressure Follow-up in 3-4 weeks

## 2017-06-20 NOTE — Progress Notes (Signed)
Subjective:    Patient ID: Rebecca Foley, female    DOB: [birth year], 57 y.o.   MRN: [id number]  HPI The patient is here for follow up.  Hypertension: She is taking her medication daily for few days but prior to that she was not taking it regularly-she may have taken it every 3-4 days, but was not taking the losartan.. She is not compliant with a low sodium diet.  She has chronic leg edema.  She has shortness of breath with exertion, which is not new and likely related to her obesity and sedentary lifestyle.  She has had some intermittent headaches.  Denies chest pain, palpitations, shortness of breath and regular headaches. She is not exercising regularly.  She does not monitor her blood pressure at home.    Prediabetes:  She is not compliant with a low sugar/carbohydrate diet.  She is not exercising regularly.  Vitamin B12 deficiency:  She is not taking B12 now.    Obesity:  She had no idea how much she weighed.  She feels ok-she goes to work and can move around without difficulty.  She does get some knee pain, which is likely related to her weight, but overall feels well.  She typically avoids the scale.  She is not exercising like she should.  She knows what she needs to do.  She has lost weight in the past.  She needs to think about what she is eating before she eats.  She needs to eat more healthy.  Medications and allergies reviewed with patient and updated if appropriate.  Patient Active Problem List   Diagnosis Date Noted  . Acute meniscal tear of right knee 08/13/2015  . Recurrent right knee instability 08/13/2015  . Pes planus of both feet 08/13/2015  . Obesity 07/22/2015  . Prediabetes 07/22/2015  . Vitamin B12 deficiency anemia 07/21/2014  . Perennial allergic rhinitis with seasonal variation 07/08/2009  . COLONIC POLYPS 07/03/2007  . KERATOCONUS 07/03/2007  . Essential hypertension 07/03/2007    Current Outpatient Medications on File Prior to Visit  Medication Sig  Dispense Refill  . cetirizine (ZYRTEC ALLERGY) 10 MG tablet Take 10 mg by mouth as needed for allergies.     . cyanocobalamin 1000 MCG tablet Take 1 tablet (1,000 mcg total) by mouth daily. 30 tablet 2  . Diclofenac Sodium (PENNSAID) 2 % SOLN Place 2 application onto the skin 2 (two) times daily. 112 g 3  . ibuprofen (ADVIL,MOTRIN) 200 MG tablet Take 800 mg by mouth every 6 (six) hours as needed (pain).    Marland Kitchen losartan (COZAAR) 100 MG tablet Take 1 tablet (100 mg total) by mouth daily. 15 tablet 0  . metoprolol succinate (TOPROL-XL) 25 MG 24 hr tablet Take 1 tablet (25 mg total) by mouth daily. --- Office visit needed for further refills 15 tablet 0  . verapamil (CALAN-SR) 120 MG CR tablet Take 1 tablet (120 mg total) by mouth daily. --- Office visit needed for further refills 15 tablet 0   No current facility-administered medications on file prior to visit.     Past Medical History:  Diagnosis Date  . Allergy    perennial  . Anemia    heavy menses  . Cellulitis of right lower leg 07/17/2014   07/2014   . Hypertension   . Impaired fasting glucose 2011   108  . Nonspecific elevation of levels of transaminase or lactic acid dehydrogenase (LDH) 2011   ALT 43    Past Surgical History:  Procedure Laterality Date  . CESAREAN SECTION    . COLONOSCOPY  2009 & 2011   polyps 2009; negative 2011  . DILATION AND CURETTAGE OF UTERUS    . INCISION AND DRAINAGE ABSCESS Right 06/17/2014   Procedure: INCISION AND DRAINAGE DEBRIDEMENT OF CHRONIC RIGH BREAST ABSCESS;  Surgeon: Greer Pickerel, MD;  Location: [facility];  Service: General;  Laterality: Right;  . incision and drainage of left breast  12/2013  . thumb sugery     trigger  . TUBAL LIGATION      Social History   Socioeconomic History  . Marital status: Married    Spouse name: Not on file  . Number of children: Not on file  . Years of education: Not on file  . Highest education level: Not on file  Occupational History  . Not on file    Social Needs  . Financial resource strain: Not on file  . Food insecurity:    Worry: Not on file    Inability: Not on file  . Transportation needs:    Medical: Not on file    Non-medical: Not on file  Tobacco Use  . Smoking status: Never Smoker  . Smokeless tobacco: Never Used  Substance and Sexual Activity  . Alcohol use: No  . Drug use: No  . Sexual activity: Not on file  Lifestyle  . Physical activity:    Days per week: Not on file    Minutes per session: Not on file  . Stress: Not on file  Relationships  . Social connections:    Talks on phone: Not on file    Gets together: Not on file    Attends religious service: Not on file    Active member of club or organization: Not on file    Attends meetings of clubs or organizations: Not on file    Relationship status: Not on file  Other Topics Concern  . Not on file  Social History Narrative  . Not on file    Family History  Problem Relation Age of Onset  . Hypertension Mother   . Hypertension Father   . Stroke Father 36  . Heart disease Father        CM  . Alcohol abuse Brother   . Cancer Brother        ? prostate; PMH of Agent Orange exposure in [place]  . Heart attack Neg Hx     Review of Systems  Constitutional: Negative for chills and fever.  Respiratory: Positive for shortness of breath (with stairs). Negative for cough and wheezing.   Cardiovascular: Positive for leg swelling (mild). Negative for chest pain and palpitations.  Endocrine: Negative for polydipsia and polyuria.  Musculoskeletal: Positive for arthralgias.  Neurological: Positive for headaches (occasionally). Negative for dizziness and light-headedness.       Objective:   Vitals:   06/20/17 1602  BP: (!) 212/80  Pulse: 75  Resp: 16  Temp: 98.6 F (37 C)  SpO2: 96%   BP Readings from Last 3 Encounters:  06/20/17 (!) 212/80  08/13/15 (!) 142/84  07/22/15 (!) 148/86   Wt Readings from Last 3 Encounters:  06/20/17 300 lb (136.1 kg)   08/13/15 286 lb (129.7 kg)  07/22/15 283 lb (128.4 kg)   Body mass index is 49.92 kg/m.   Physical Exam    Constitutional: Appears well-developed and well-nourished. No distress.  HENT:  Head: Normocephalic and atraumatic.  Neck: Neck supple. No tracheal deviation present. No thyromegaly present.  No cervical lymphadenopathy Cardiovascular: Normal rate, regular rhythm and normal heart sounds.   2/6 systolic murmur heard. No carotid bruit .  1+ nonpitting bilateral lower extremity edema Pulmonary/Chest: Effort normal and breath sounds normal. No respiratory distress. No has no wheezes. No rales.  Skin: Skin is warm and dry. Not diaphoretic.  Psychiatric: Normal mood and affect. Behavior is normal.      Assessment & Plan:    See Problem List for Assessment and Plan of chronic medical problems.

## 2017-06-20 NOTE — Assessment & Plan Note (Signed)
Check a1c Low sugar / carb diet Stressed regular exercise, weight loss  

## 2017-06-22 ENCOUNTER — Encounter: Payer: Self-pay | Admitting: Internal Medicine

## 2017-07-05 IMAGING — DX DG KNEE COMPLETE 4+V*R*
4 series · 4 of 4 positions shown · non-contrast
Comparison: None.

CLINICAL DATA: Chronic knee pain and swelling for 2 months, no
known injury, initial encounter

EXAM:
RIGHT KNEE - COMPLETE 4+ VIEW

[knee ap]
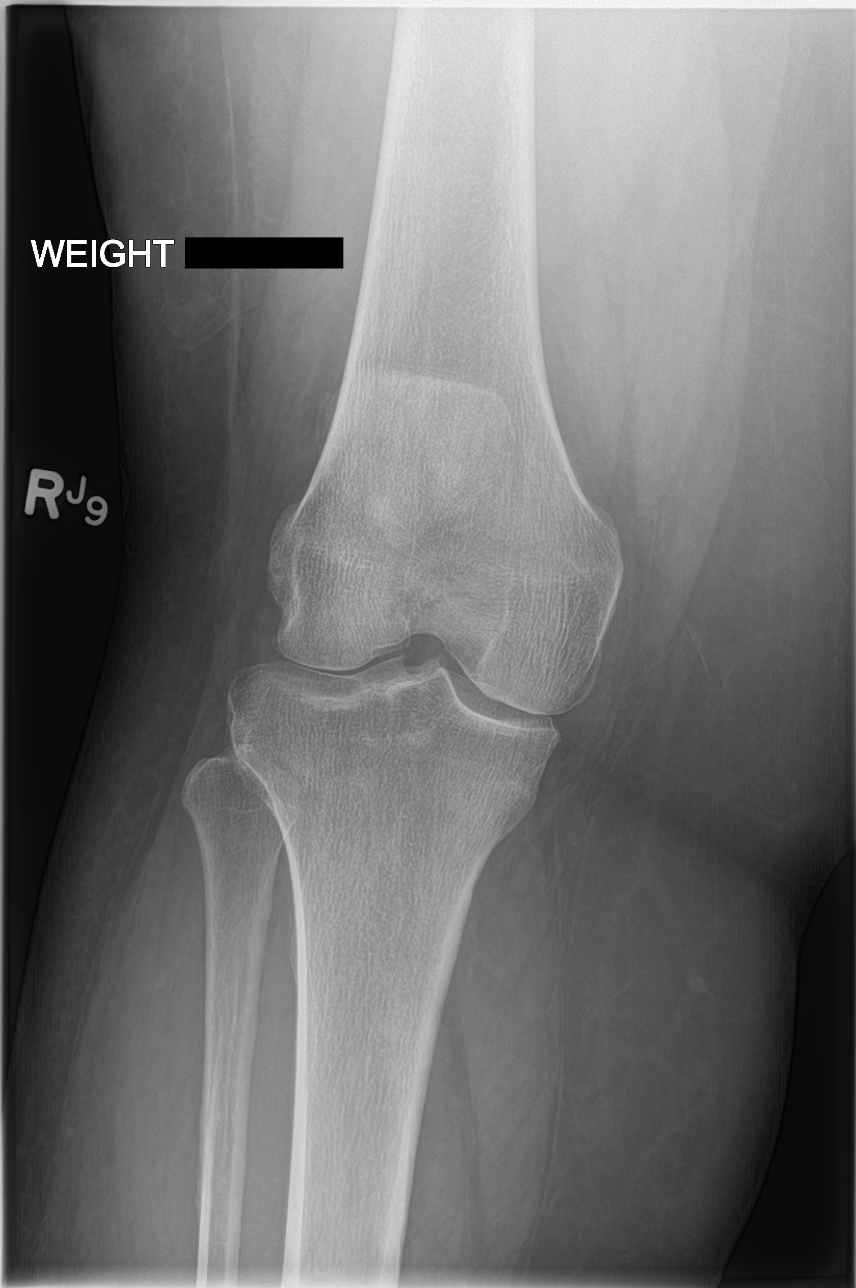

[knee tunnel]
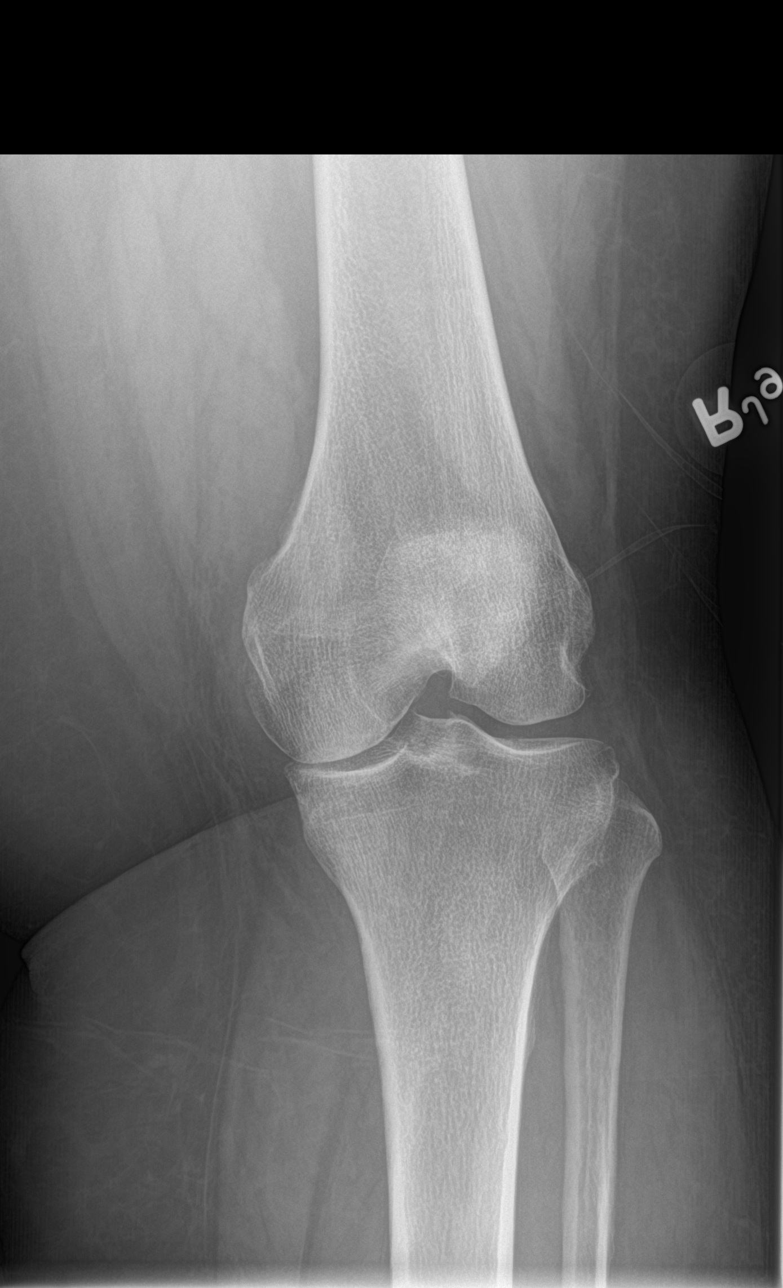

[knee lat]
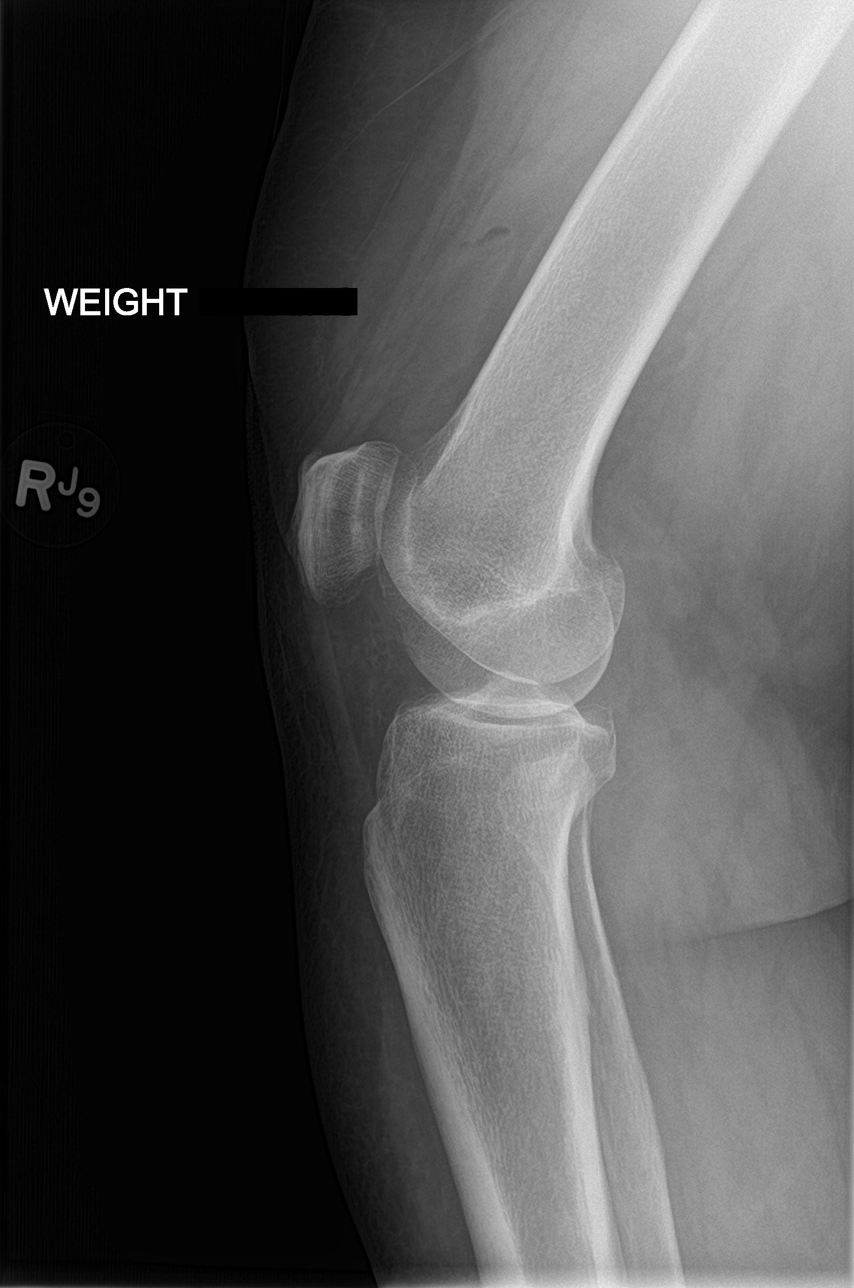

[sunrise]
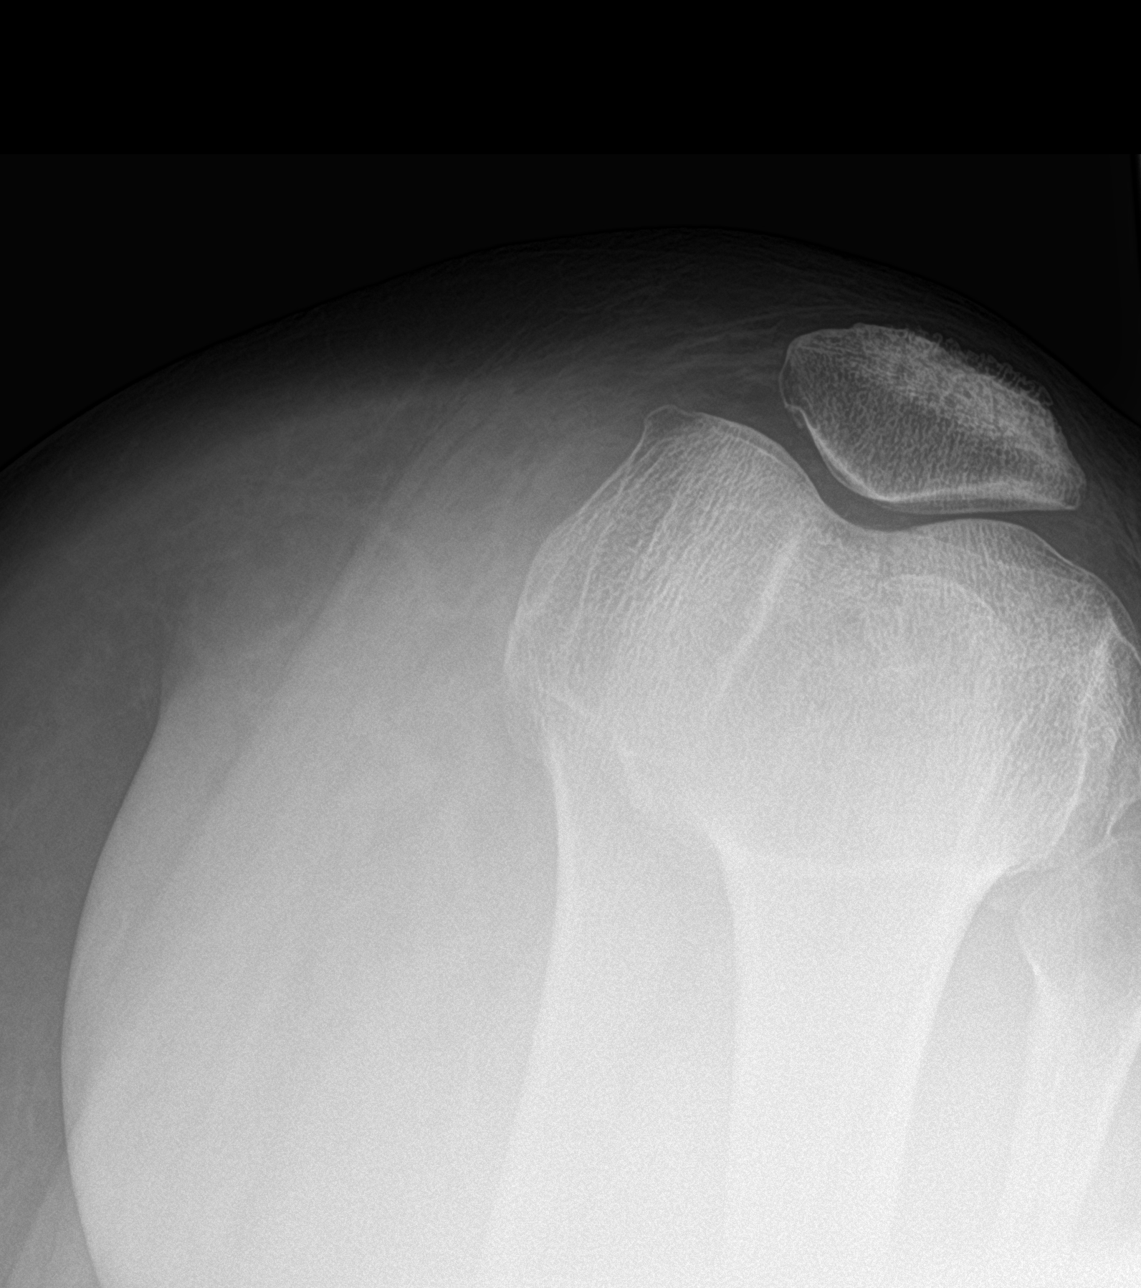

[4 of 4 positions shown; findings below may reference images not displayed]

FINDINGS: Medial joint space narrowing is noted. No acute fracture or
dislocation is seen. No gross soft tissue abnormality is noted.
IMPRESSION: Mild degenerative change without acute abnormality.

## 2017-07-11 NOTE — Progress Notes (Deleted)
Subjective:    Patient ID: Rebecca Foley, female    DOB: 01-11-1961, 56 y.o.   MRN: [id number]  HPI The patient is here for follow up.  Hypertension: She is taking her medication daily. She is compliant with a low sodium diet.  She denies chest pain, palpitations, edema, shortness of breath and regular headaches. She is exercising regularly.  She does not monitor her blood pressure at home.    Prediabetes:  She is compliant with a low sugar/carbohydrate diet.  She is exercising regularly.  Vitamin B12 def:    Obesity:    Medications and allergies reviewed with patient and updated if appropriate.  Patient Active Problem List   Diagnosis Date Noted  . Acute meniscal tear of right knee 08/13/2015  . Recurrent right knee instability 08/13/2015  . Pes planus of both feet 08/13/2015  . Obesity 07/22/2015  . Prediabetes 07/22/2015  . Vitamin B12 deficiency anemia 07/21/2014  . Perennial allergic rhinitis with seasonal variation 07/08/2009  . COLONIC POLYPS 07/03/2007  . KERATOCONUS 07/03/2007  . Essential hypertension 07/03/2007    Current Outpatient Medications on File Prior to Visit  Medication Sig Dispense Refill  . cetirizine (ZYRTEC ALLERGY) 10 MG tablet Take 10 mg by mouth as needed for allergies.     . cyanocobalamin 1000 MCG tablet Take 1 tablet (1,000 mcg total) by mouth daily. 30 tablet 2  . Diclofenac Sodium (PENNSAID) 2 % SOLN Place 2 application onto the skin 2 (two) times daily. 112 g 3  . ibuprofen (ADVIL,MOTRIN) 200 MG tablet Take 800 mg by mouth every 6 (six) hours as needed (pain).    . metoprolol succinate (TOPROL-XL) 25 MG 24 hr tablet Take 1 tablet (25 mg total) by mouth daily. 30 tablet 5  . olmesartan-hydrochlorothiazide (BENICAR HCT) 40-25 MG tablet Take 1 tablet by mouth daily. 30 tablet 5  . verapamil (CALAN-SR) 120 MG CR tablet Take 1 tablet (120 mg total) by mouth daily. 30 tablet 5   No current facility-administered medications on file prior to  visit.     Past Medical History:  Diagnosis Date  . Allergy    perennial  . Anemia    heavy menses  . Cellulitis of right lower leg 07/17/2014   07/2014   . Hypertension   . Impaired fasting glucose 2011   108  . Nonspecific elevation of levels of transaminase or lactic acid dehydrogenase (LDH) 2011   ALT 43    Past Surgical History:  Procedure Laterality Date  . CESAREAN SECTION    . COLONOSCOPY  2009 & 2011   polyps 2009; negative 2011  . DILATION AND CURETTAGE OF UTERUS    . INCISION AND DRAINAGE ABSCESS Right 06/17/2014   Procedure: INCISION AND DRAINAGE DEBRIDEMENT OF CHRONIC RIGH BREAST ABSCESS;  Surgeon: Greer Pickerel, MD;  Location: [facility];  Service: General;  Laterality: Right;  . incision and drainage of left breast  12/2013  . thumb sugery     trigger  . TUBAL LIGATION      Social History   Socioeconomic History  . Marital status: Married    Spouse name: Not on file  . Number of children: Not on file  . Years of education: Not on file  . Highest education level: Not on file  Occupational History  . Not on file  Social Needs  . Financial resource strain: Not on file  . Food insecurity:    Worry: Not on file    Inability: Not  on file  . Transportation needs:    Medical: Not on file    Non-medical: Not on file  Tobacco Use  . Smoking status: Never Smoker  . Smokeless tobacco: Never Used  Substance and Sexual Activity  . Alcohol use: No  . Drug use: No  . Sexual activity: Not on file  Lifestyle  . Physical activity:    Days per week: Not on file    Minutes per session: Not on file  . Stress: Not on file  Relationships  . Social connections:    Talks on phone: Not on file    Gets together: Not on file    Attends religious service: Not on file    Active member of club or organization: Not on file    Attends meetings of clubs or organizations: Not on file    Relationship status: Not on file  Other Topics Concern  . Not on file  Social History  Narrative  . Not on file    Family History  Problem Relation Age of Onset  . Hypertension Mother   . Hypertension Father   . Stroke Father 33  . Heart disease Father        CM  . Alcohol abuse Brother   . Cancer Brother        ? prostate; PMH of Agent Orange exposure in [place]  . Heart attack Neg Hx     Review of Systems     Objective:  There were no vitals filed for this visit. BP Readings from Last 3 Encounters:  06/20/17 (!) 212/80  08/13/15 (!) 142/84  07/22/15 (!) 148/86   Wt Readings from Last 3 Encounters:  06/20/17 300 lb (136.1 kg)  08/13/15 286 lb (129.7 kg)  07/22/15 283 lb (128.4 kg)   There is no height or weight on file to calculate BMI.   Physical Exam    Constitutional: Appears well-developed and well-nourished. No distress.  HENT:  Head: Normocephalic and atraumatic.  Neck: Neck supple. No tracheal deviation present. No thyromegaly present.  No cervical lymphadenopathy Cardiovascular: Normal rate, regular rhythm and normal heart sounds.   No murmur heard. No carotid bruit .  No edema Pulmonary/Chest: Effort normal and breath sounds normal. No respiratory distress. No has no wheezes. No rales.  Skin: Skin is warm and dry. Not diaphoretic.  Psychiatric: Normal mood and affect. Behavior is normal.      Assessment & Plan:    See Problem List for Assessment and Plan of chronic medical problems.

## 2017-07-12 ENCOUNTER — Ambulatory Visit: Payer: 59 | Admitting: Internal Medicine

## 2017-07-18 ENCOUNTER — Ambulatory Visit: Payer: 59 | Admitting: Internal Medicine

## 2017-07-18 ENCOUNTER — Encounter: Payer: Self-pay | Admitting: Internal Medicine

## 2017-07-18 VITALS — BP 192/78 | HR 89 | Temp 98.1°F | Resp 16 | Wt 291.0 lb

## 2017-07-18 DIAGNOSIS — R7303 Prediabetes: Secondary | ICD-10-CM | POA: Diagnosis not present

## 2017-07-18 DIAGNOSIS — E66813 Obesity, class 3: Secondary | ICD-10-CM

## 2017-07-18 DIAGNOSIS — I10 Essential (primary) hypertension: Secondary | ICD-10-CM

## 2017-07-18 DIAGNOSIS — Z6841 Body Mass Index (BMI) 40.0 and over, adult: Secondary | ICD-10-CM | POA: Diagnosis not present

## 2017-07-18 MED ORDER — OLMESARTAN MEDOXOMIL-HCTZ 40-25 MG PO TABS: 1.0000 | ORAL_TABLET | Freq: Every day | ORAL | 5 refills | Status: DC

## 2017-07-18 MED ORDER — METOPROLOL SUCCINATE ER 50 MG PO TB24: 50.0000 mg | ORAL_TABLET | Freq: Every day | ORAL | 3 refills | Status: DC

## 2017-07-18 MED ORDER — AMLODIPINE BESYLATE 5 MG PO TABS: 5.0000 mg | ORAL_TABLET | Freq: Every day | ORAL | 3 refills | Status: DC

## 2017-07-18 NOTE — Progress Notes (Signed)
Subjective:    Patient ID: Rebecca Foley, female    DOB: 1960/07/22, 57 y.o.   MRN: [id number]  HPI The patient is here for follow up.  She has made some life changes since she was here last.  She is going to the gym 3-4 times a week -walking 45 minutes or 2 miles each time.  She taking the stairs when she can.  She stopped drinking sodas and less tea.  She is trying to eat more healthy and has decreased her portions.  She has lost weight since she was here last.  She looked into the healthy weight and wellness clinic, but she would have to pay her deductible first and it would be expensive.  Hypertension: She is taking her medication daily. She is more compliant with a low sodium diet.  She denies chest pain, palpitations, edema, shortness of breath and regular headaches. She is exercising regularly.  She has lost weight and some of her daily activities have gotten easier.  She does not monitor her blood pressure at home, but was thinking about getting a blood pressure cuff for home.  She can also see the nurse at work.    Prediabetes:  She is more compliant with a low sugar/carbohydrate diet.  She is exercising regularly.   Obesity: She has lost 8-9 pounds since she was here last.  She is exercising regularly-she walks on the treadmill 3-4 times a week for approximately 2 miles each time.  She has been eating more healthy and has tried to decrease her portion size.  She knows she needs to continue doing what she is doing.  Medications and allergies reviewed with patient and updated if appropriate.  Patient Active Problem List   Diagnosis Date Noted  . Acute meniscal tear of right knee 08/13/2015  . Recurrent right knee instability 08/13/2015  . Pes planus of both feet 08/13/2015  . Obesity 07/22/2015  . Prediabetes 07/22/2015  . Vitamin B12 deficiency anemia 07/21/2014  . Perennial allergic rhinitis with seasonal variation 07/08/2009  . COLONIC POLYPS 07/03/2007  . KERATOCONUS  07/03/2007  . Essential hypertension 07/03/2007    Current Outpatient Medications on File Prior to Visit  Medication Sig Dispense Refill  . cetirizine (ZYRTEC ALLERGY) 10 MG tablet Take 10 mg by mouth as needed for allergies.     . Diclofenac Sodium (PENNSAID) 2 % SOLN Place 2 application onto the skin 2 (two) times daily. 112 g 3  . ibuprofen (ADVIL,MOTRIN) 200 MG tablet Take 800 mg by mouth every 6 (six) hours as needed (pain).    . metoprolol succinate (TOPROL-XL) 25 MG 24 hr tablet Take 1 tablet (25 mg total) by mouth daily. 30 tablet 5  . olmesartan-hydrochlorothiazide (BENICAR HCT) 40-25 MG tablet Take 1 tablet by mouth daily. 30 tablet 5  . verapamil (CALAN-SR) 120 MG CR tablet Take 1 tablet (120 mg total) by mouth daily. 30 tablet 5   No current facility-administered medications on file prior to visit.     Past Medical History:  Diagnosis Date  . Allergy    perennial  . Anemia    heavy menses  . Cellulitis of right lower leg 07/17/2014   07/2014   . Hypertension   . Impaired fasting glucose 2011   108  . Nonspecific elevation of levels of transaminase or lactic acid dehydrogenase (LDH) 2011   ALT 43    Past Surgical History:  Procedure Laterality Date  . CESAREAN SECTION    .  COLONOSCOPY  2009 & 2011   polyps 2009; negative 2011  . DILATION AND CURETTAGE OF UTERUS    . INCISION AND DRAINAGE ABSCESS Right 06/17/2014   Procedure: INCISION AND DRAINAGE DEBRIDEMENT OF CHRONIC RIGH BREAST ABSCESS;  Surgeon: Greer Pickerel, MD;  Location: [facility];  Service: General;  Laterality: Right;  . incision and drainage of left breast  12/2013  . thumb sugery     trigger  . TUBAL LIGATION      Social History   Socioeconomic History  . Marital status: Married    Spouse name: Not on file  . Number of children: Not on file  . Years of education: Not on file  . Highest education level: Not on file  Occupational History  . Not on file  Social Needs  . Financial resource strain: Not  on file  . Food insecurity:    Worry: Not on file    Inability: Not on file  . Transportation needs:    Medical: Not on file    Non-medical: Not on file  Tobacco Use  . Smoking status: Never Smoker  . Smokeless tobacco: Never Used  Substance and Sexual Activity  . Alcohol use: No  . Drug use: No  . Sexual activity: Not on file  Lifestyle  . Physical activity:    Days per week: Not on file    Minutes per session: Not on file  . Stress: Not on file  Relationships  . Social connections:    Talks on phone: Not on file    Gets together: Not on file    Attends religious service: Not on file    Active member of club or organization: Not on file    Attends meetings of clubs or organizations: Not on file    Relationship status: Not on file  Other Topics Concern  . Not on file  Social History Narrative  . Not on file    Family History  Problem Relation Age of Onset  . Hypertension Mother   . Hypertension Father   . Stroke Father 40  . Heart disease Father        CM  . Alcohol abuse Brother   . Cancer Brother        ? prostate; PMH of Agent Orange exposure in [place]  . Heart attack Neg Hx     Review of Systems  Constitutional: Negative for chills and fever.  Respiratory: Negative for cough, shortness of breath and wheezing.   Cardiovascular: Negative for chest pain, palpitations and leg swelling.  Neurological: Negative for light-headedness and headaches.       Objective:   Vitals:   07/18/17 1606  BP: (!) 192/78  Pulse: 89  Resp: 16  Temp: 98.1 F (36.7 C)  SpO2: 96%   BP Readings from Last 3 Encounters:  07/18/17 (!) 192/78  06/20/17 (!) 212/80  08/13/15 (!) 142/84   Wt Readings from Last 3 Encounters:  07/18/17 291 lb (132 kg)  06/20/17 300 lb (136.1 kg)  08/13/15 286 lb (129.7 kg)   Body mass index is 48.42 kg/m.   Physical Exam    Constitutional: Appears well-developed and well-nourished. No distress.  HENT:  Head: Normocephalic and atraumatic.    Neck: Neck supple. No tracheal deviation present. No thyromegaly present.  No cervical lymphadenopathy Cardiovascular: Normal rate, regular rhythm and normal heart sounds.   2/6 systolic murmur heard. No carotid bruit .  No edema Pulmonary/Chest: Effort normal and breath sounds normal. No respiratory  distress. No has no wheezes. No rales.  Skin: Skin is warm and dry. Not diaphoretic.  Psychiatric: Normal mood and affect. Behavior is normal.      Assessment & Plan:    See Problem List for Assessment and Plan of chronic medical problems.

## 2017-07-18 NOTE — Assessment & Plan Note (Signed)
Reviewed last blood work A1c stable in prediabetic range Continue diabetic diet Continue regular exercise and weight loss efforts

## 2017-07-18 NOTE — Patient Instructions (Addendum)
Stop verapamil when you finish it.  Take amlodipine 5 mg daily when you are done with the verapamil.  Continue the olmesartan- hctz daily.   Increase the metoprolol XL 50 mg daily.  You can take two of what you have for now until your finish.   Your goal BP is less than 140/90.  Monitor your BP.     Follow up in 6 weeks.

## 2017-07-18 NOTE — Assessment & Plan Note (Signed)
She has successfully lost weight since her last visit Encouraged her to continue her efforts in making lifestyle changes Continue to decrease carbohydrates and sugar intake Continue to decrease portion sizes Continue regular exercise Follow-up in 6 weeks

## 2017-07-18 NOTE — Assessment & Plan Note (Signed)
Resistant hypertension-has been uncontrolled for over 2 years Continue low-sodium diet, regular exercise and weight loss efforts She has been taking verapamil, which is a short acting medication only once a day-we will discontinue and start amlodipine 5 mg daily Increase metoprolol XL to 50 mg daily Continue on valsartan-hydrochlorothiazide 40-25 mg daily Encouraged her to continue her medication daily and not to be discouraged by the amount of medication she is taking.  Stressed the importance of getting her blood pressure well controlled to prevent long-term effects from uncontrolled hypertension Most likely may need an additional medication Advised her her goal blood pressures less than 140/90 Encouraged her to call and let me know if her blood pressure is not controlled so we can make changes over the phone.

## 2017-08-30 ENCOUNTER — Ambulatory Visit: Payer: 59 | Admitting: Internal Medicine

## 2017-08-30 NOTE — Progress Notes (Deleted)
Subjective:    Patient ID: Rebecca Foley, female    DOB: 18-May-1960, 57 y.o.   MRN: [id number]  HPI The patient is here for follow up.  She continues to exercise on a regular basis.  She is eating more healthy.  She is working on weight loss.  Hypertension: She is taking her medication daily. She is compliant with a low sodium diet.  She denies chest pain, palpitations, edema, shortness of breath and regular headaches. She is exercising regularly.  She does not monitor her blood pressure at home.    Prediabetes:  She is compliant with a low sugar/carbohydrate diet.  She is exercising regularly.  Obesity: She is exercising regularly.  She is eating more healthy and is decreased her portions.  She has lost weight and continues to work on weight loss efforts.  Medications and allergies reviewed with patient and updated if appropriate.  Patient Active Problem List   Diagnosis Date Noted  . Acute meniscal tear of right knee 08/13/2015  . Recurrent right knee instability 08/13/2015  . Pes planus of both feet 08/13/2015  . Obesity 07/22/2015  . Prediabetes 07/22/2015  . Vitamin B12 deficiency anemia 07/21/2014  . Perennial allergic rhinitis with seasonal variation 07/08/2009  . COLONIC POLYPS 07/03/2007  . KERATOCONUS 07/03/2007  . Essential hypertension 07/03/2007    Current Outpatient Medications on File Prior to Visit  Medication Sig Dispense Refill  . amLODipine (NORVASC) 5 MG tablet Take 1 tablet (5 mg total) by mouth daily. 90 tablet 3  . cetirizine (ZYRTEC ALLERGY) 10 MG tablet Take 10 mg by mouth as needed for allergies.     . Diclofenac Sodium (PENNSAID) 2 % SOLN Place 2 application onto the skin 2 (two) times daily. 112 g 3  . ibuprofen (ADVIL,MOTRIN) 200 MG tablet Take 800 mg by mouth every 6 (six) hours as needed (pain).    . metoprolol succinate (TOPROL-XL) 50 MG 24 hr tablet Take 1 tablet (50 mg total) by mouth daily. Take with or immediately following a meal. 90  tablet 3  . olmesartan-hydrochlorothiazide (BENICAR HCT) 40-25 MG tablet Take 1 tablet by mouth daily. 30 tablet 5   No current facility-administered medications on file prior to visit.     Past Medical History:  Diagnosis Date  . Allergy    perennial  . Anemia    heavy menses  . Cellulitis of right lower leg 07/17/2014   07/2014   . Hypertension   . Impaired fasting glucose 2011   108  . Nonspecific elevation of levels of transaminase or lactic acid dehydrogenase (LDH) 2011   ALT 43    Past Surgical History:  Procedure Laterality Date  . CESAREAN SECTION    . COLONOSCOPY  2009 & 2011   polyps 2009; negative 2011  . DILATION AND CURETTAGE OF UTERUS    . INCISION AND DRAINAGE ABSCESS Right 06/17/2014   Procedure: INCISION AND DRAINAGE DEBRIDEMENT OF CHRONIC RIGH BREAST ABSCESS;  Surgeon: Greer Pickerel, MD;  Location: [facility];  Service: General;  Laterality: Right;  . incision and drainage of left breast  12/2013  . thumb sugery     trigger  . TUBAL LIGATION      Social History   Socioeconomic History  . Marital status: Married    Spouse name: Not on file  . Number of children: Not on file  . Years of education: Not on file  . Highest education level: Not on file  Occupational History  .  Not on file  Social Needs  . Financial resource strain: Not on file  . Food insecurity:    Worry: Not on file    Inability: Not on file  . Transportation needs:    Medical: Not on file    Non-medical: Not on file  Tobacco Use  . Smoking status: Never Smoker  . Smokeless tobacco: Never Used  Substance and Sexual Activity  . Alcohol use: No  . Drug use: No  . Sexual activity: Not on file  Lifestyle  . Physical activity:    Days per week: Not on file    Minutes per session: Not on file  . Stress: Not on file  Relationships  . Social connections:    Talks on phone: Not on file    Gets together: Not on file    Attends religious service: Not on file    Active member of club or  organization: Not on file    Attends meetings of clubs or organizations: Not on file    Relationship status: Not on file  Other Topics Concern  . Not on file  Social History Narrative  . Not on file    Family History  Problem Relation Age of Onset  . Hypertension Mother   . Hypertension Father   . Stroke Father 66  . Heart disease Father        CM  . Alcohol abuse Brother   . Cancer Brother        ? prostate; PMH of Agent Orange exposure in [place]  . Heart attack Neg Hx     Review of Systems     Objective:  There were no vitals filed for this visit. BP Readings from Last 3 Encounters:  07/18/17 (!) 192/78  06/20/17 (!) 212/80  08/13/15 (!) 142/84   Wt Readings from Last 3 Encounters:  07/18/17 291 lb (132 kg)  06/20/17 300 lb (136.1 kg)  08/13/15 286 lb (129.7 kg)   There is no height or weight on file to calculate BMI.   Physical Exam    Constitutional: Appears well-developed and well-nourished. No distress.  HENT:  Head: Normocephalic and atraumatic.  Neck: Neck supple. No tracheal deviation present. No thyromegaly present.  No cervical lymphadenopathy Cardiovascular: Normal rate, regular rhythm and normal heart sounds.   No murmur heard. No carotid bruit .  No edema Pulmonary/Chest: Effort normal and breath sounds normal. No respiratory distress. No has no wheezes. No rales.  Skin: Skin is warm and dry. Not diaphoretic.  Psychiatric: Normal mood and affect. Behavior is normal.      Assessment & Plan:    See Problem List for Assessment and Plan of chronic medical problems.

## 2017-09-14 NOTE — Progress Notes (Signed)
Subjective:    Patient ID: Rebecca Foley, female    DOB: 1960-06-24, 57 y.o.   MRN: [id number]  HPI The patient is here for follow up.  She continues to exercise on a regular basis - walks 45 minutes on the treadmill, 2.5 miles.  She is eating more healthy.  She is working on weight loss.  Hypertension: She is taking her medication daily. She is compliant with a low sodium diet.  She denies chest pain, palpitations, edema, shortness of breath and regular headaches. She is exercising regularly.  She does monitor her blood pressure at home - it is similar to what it is today.    Prediabetes:  She is compliant with a low sugar/carbohydrate diet.  She is exercising regularly.  Obesity: She is exercising regularly.  She is eating more healthy and is decreased her portions.  She has lost weight and continues to work on weight loss efforts.  Medications and allergies reviewed with patient and updated if appropriate.  Patient Active Problem List   Diagnosis Date Noted  . Acute meniscal tear of right knee 08/13/2015  . Recurrent right knee instability 08/13/2015  . Pes planus of both feet 08/13/2015  . Obesity 07/22/2015  . Prediabetes 07/22/2015  . Vitamin B12 deficiency anemia 07/21/2014  . Perennial allergic rhinitis with seasonal variation 07/08/2009  . COLONIC POLYPS 07/03/2007  . KERATOCONUS 07/03/2007  . Essential hypertension 07/03/2007    Current Outpatient Medications on File Prior to Visit  Medication Sig Dispense Refill  . amLODipine (NORVASC) 5 MG tablet Take 1 tablet (5 mg total) by mouth daily. 90 tablet 3  . ibuprofen (ADVIL,MOTRIN) 200 MG tablet Take 800 mg by mouth every 6 (six) hours as needed (pain).    . metoprolol succinate (TOPROL-XL) 50 MG 24 hr tablet Take 1 tablet (50 mg total) by mouth daily. Take with or immediately following a meal. 90 tablet 3  . olmesartan-hydrochlorothiazide (BENICAR HCT) 40-25 MG tablet Take 1 tablet by mouth daily. 30 tablet 5   No  current facility-administered medications on file prior to visit.     Past Medical History:  Diagnosis Date  . Allergy    perennial  . Anemia    heavy menses  . Cellulitis of right lower leg 07/17/2014   07/2014   . Hypertension   . Impaired fasting glucose 2011   108  . Nonspecific elevation of levels of transaminase or lactic acid dehydrogenase (LDH) 2011   ALT 43    Past Surgical History:  Procedure Laterality Date  . CESAREAN SECTION    . COLONOSCOPY  2009 & 2011   polyps 2009; negative 2011  . DILATION AND CURETTAGE OF UTERUS    . INCISION AND DRAINAGE ABSCESS Right 06/17/2014   Procedure: INCISION AND DRAINAGE DEBRIDEMENT OF CHRONIC RIGH BREAST ABSCESS;  Surgeon: Greer Pickerel, MD;  Location: [place];  Service: General;  Laterality: Right;  . incision and drainage of left breast  12/2013  . thumb sugery     trigger  . TUBAL LIGATION      Social History   Socioeconomic History  . Marital status: Married    Spouse name: Not on file  . Number of children: Not on file  . Years of education: Not on file  . Highest education level: Not on file  Occupational History  . Not on file  Social Needs  . Financial resource strain: Not on file  . Food insecurity:    Worry: Not on  file    Inability: Not on file  . Transportation needs:    Medical: Not on file    Non-medical: Not on file  Tobacco Use  . Smoking status: Never Smoker  . Smokeless tobacco: Never Used  Substance and Sexual Activity  . Alcohol use: No  . Drug use: No  . Sexual activity: Not on file  Lifestyle  . Physical activity:    Days per week: Not on file    Minutes per session: Not on file  . Stress: Not on file  Relationships  . Social connections:    Talks on phone: Not on file    Gets together: Not on file    Attends religious service: Not on file    Active member of club or organization: Not on file    Attends meetings of clubs or organizations: Not on file    Relationship status: Not on file    Other Topics Concern  . Not on file  Social History Narrative  . Not on file    Family History  Problem Relation Age of Onset  . Hypertension Mother   . Hypertension Father   . Stroke Father 29  . Heart disease Father        CM  . Alcohol abuse Brother   . Cancer Brother        ? prostate; PMH of Agent Orange exposure in [place]  . Heart attack Neg Hx     Review of Systems  Constitutional: Negative for chills and fever.  Respiratory: Negative for cough, shortness of breath and wheezing.   Cardiovascular: Positive for leg swelling (better - improved). Negative for chest pain and palpitations.  Neurological: Negative for dizziness, light-headedness and headaches.       Objective:   Vitals:   09/15/17 0752  BP: (!) 152/68  Pulse: (!) 105  Resp: 16  Temp: 98.5 F (36.9 C)  SpO2: 96%   BP Readings from Last 3 Encounters:  09/15/17 (!) 152/68  07/18/17 (!) 192/78  06/20/17 (!) 212/80   Wt Readings from Last 3 Encounters:  09/15/17 284 lb (128.8 kg)  07/18/17 291 lb (132 kg)  06/20/17 300 lb (136.1 kg)   Body mass index is 47.26 kg/m.   Physical Exam    Constitutional: Appears well-developed and well-nourished. No distress.  HENT:  Head: Normocephalic and atraumatic.  Neck: Neck supple. No tracheal deviation present. No thyromegaly present.  No cervical lymphadenopathy Cardiovascular: Normal rate, regular rhythm and normal heart sounds.   No murmur heard. No carotid bruit .  No edema Pulmonary/Chest: Effort normal and breath sounds normal. No respiratory distress. No has no wheezes. No rales.  Skin: Skin is warm and dry. Not diaphoretic.  Psychiatric: Normal mood and affect. Behavior is normal.      Assessment & Plan:    See Problem List for Assessment and Plan of chronic medical problems.

## 2017-09-15 ENCOUNTER — Encounter: Payer: Self-pay | Admitting: Internal Medicine

## 2017-09-15 ENCOUNTER — Ambulatory Visit: Payer: 59 | Admitting: Internal Medicine

## 2017-09-15 ENCOUNTER — Other Ambulatory Visit (INDEPENDENT_AMBULATORY_CARE_PROVIDER_SITE_OTHER): Payer: 59

## 2017-09-15 VITALS — BP 152/68 | HR 105 | Temp 98.5°F | Resp 16 | Wt 284.0 lb

## 2017-09-15 DIAGNOSIS — I10 Essential (primary) hypertension: Secondary | ICD-10-CM

## 2017-09-15 DIAGNOSIS — D519 Vitamin B12 deficiency anemia, unspecified: Secondary | ICD-10-CM

## 2017-09-15 DIAGNOSIS — R7303 Prediabetes: Secondary | ICD-10-CM

## 2017-09-15 LAB — COMPREHENSIVE METABOLIC PANEL
ALT: 34 U/L (ref 0–35)
AST: 25 U/L (ref 0–37)
Albumin: 4.1 g/dL (ref 3.5–5.2)
Alkaline Phosphatase: 53 U/L (ref 39–117)
BUN: 14 mg/dL (ref 6–23)
CHLORIDE: 104 meq/L (ref 96–112)
CO2: 30 meq/L (ref 19–32)
CREATININE: 0.86 mg/dL (ref 0.40–1.20)
Calcium: 10.1 mg/dL (ref 8.4–10.5)
GFR: 86.76 mL/min (ref >60.00)
Glucose, Bld: 92 mg/dL (ref 70–99)
POTASSIUM: 3.6 meq/L (ref 3.5–5.1)
SODIUM: 141 meq/L (ref 135–145)
Total Bilirubin: 0.4 mg/dL (ref 0.2–1.2)
Total Protein: 7.4 g/dL (ref 6.0–8.3)

## 2017-09-15 LAB — VITAMIN B12: Vitamin B-12: 219 pg/mL (ref 211–911)

## 2017-09-15 LAB — HEMOGLOBIN A1C: HEMOGLOBIN A1C: 6.2 % (ref 4.6–6.5)

## 2017-09-15 MED ORDER — METOPROLOL SUCCINATE ER 100 MG PO TB24: 100.0000 mg | ORAL_TABLET | Freq: Every day | ORAL | 3 refills | Status: DC

## 2017-09-15 NOTE — Assessment & Plan Note (Signed)
Check a1c Low sugar / carb diet Stressed regular exercise, weight loss  

## 2017-09-15 NOTE — Assessment & Plan Note (Signed)
Check B12 level. 

## 2017-09-15 NOTE — Assessment & Plan Note (Signed)
Bp not ideally controlled Increase metoprolol to 100 mg daily Continue other medications Continue weight loss efforts Healthy diet, low sodium cmp

## 2017-09-15 NOTE — Patient Instructions (Signed)
  Test(s) ordered today. Your results will be released to [place] (or called to you) after review, usually within 72hours after test completion. If any changes need to be made, you will be notified at that same time.   Medications reviewed and updated.  Changes include increasing metoprolol to 75 mg twice daily.    Your prescription(s) have been submitted to your pharmacy. Please take as directed and contact our office if you believe you are having problem(s) with the medication(s).   Please followup in 6 months

## 2017-09-16 ENCOUNTER — Encounter: Payer: Self-pay | Admitting: Internal Medicine

## 2017-10-07 ENCOUNTER — Encounter: Payer: Self-pay | Admitting: *Deleted

## 2017-10-07 ENCOUNTER — Encounter: Payer: Self-pay | Admitting: Family Medicine

## 2017-10-07 ENCOUNTER — Ambulatory Visit: Payer: 59 | Admitting: Family Medicine

## 2017-10-07 VITALS — BP 172/80 | HR 92 | Temp 99.0°F | Resp 16 | Wt 285.0 lb

## 2017-10-07 DIAGNOSIS — I10 Essential (primary) hypertension: Secondary | ICD-10-CM

## 2017-10-07 DIAGNOSIS — M25561 Pain in right knee: Secondary | ICD-10-CM | POA: Diagnosis not present

## 2017-10-07 MED ORDER — DICLOFENAC SODIUM 1 % TD GEL: 4.0000 g | Freq: Four times a day (QID) | TRANSDERMAL | 0 refills | Status: AC

## 2017-10-07 NOTE — Patient Instructions (Signed)
A few things to remember from today's visit:   Right knee pain, unspecified chronicity - Plan: diclofenac sodium (VOLTAREN) 1 % GEL  Essential hypertension  Blood pressure goal for most people is less than 140/90. Some populations (older than 60) the goal is less than 150/90.  Most recent cardiologists' recommendations recommend blood pressure at or less than 130/80.   Elevated blood pressure increases the risk of strokes, heart and kidney disease, and eye problems. Regular physical activity and a healthy diet (DASH diet) usually help. Low salt diet. Take medications as instructed.  Caution with some over the counter medications as cold medications, dietary products (for weight loss), and Ibuprofen or Aleve (frequent use);all these medications could cause elevation of blood pressure.   Monitor blood pressure daily. Avoid Ibuprofen and  Aleve.    Please be sure medication list is accurate. If a new problem present, please set up appointment sooner than planned today.

## 2017-10-07 NOTE — Progress Notes (Signed)
ACUTE VISIT   HPI:  Chief Complaint  Patient presents with  . Knee Pain    rt x 4 days, no injury    [name] is a 57 y.o. female, who is here today complaining of 4 days of worsening right knee pain. Hx of knee pain. Exacerbated by walking stairs,prolonged standing,and walking. Alleviated by rest.  No Hx of trauma. No edema or erythema. No numbness or tingling.  Soreness in medial aspect,intermittnet, it feels better today. Hx of meniscal tear right knee.  Lower back pain and right hip pain intermittently.  She has taken Ibuprofen and Aleve.  Noted BP elevation. Hx of HTN. Home BP 160's/? She is on Amlodipine 5 mg daily,Benicar HCT 40-25 mg daily,and Metoprolol XL 100 mg daily. She just took some of her meds today.  Denies headache, visual changes, chest pain, dyspnea, palpitation, claudication, focal weakness, or edema.    Review of Systems  Constitutional: Negative for chills and fever.  Respiratory: Negative for shortness of breath and wheezing.   Cardiovascular: Negative for chest pain, palpitations and leg swelling.  Gastrointestinal: Negative for abdominal pain, nausea and vomiting.  Genitourinary: Negative for decreased urine volume and hematuria.  Musculoskeletal: Positive for arthralgias. Negative for joint swelling.  Skin: Negative for color change and pallor.  Neurological: Negative for syncope, weakness, numbness and headaches.      Current Outpatient Medications on File Prior to Visit  Medication Sig Dispense Refill  . amLODipine (NORVASC) 5 MG tablet Take 1 tablet (5 mg total) by mouth daily. 90 tablet 3  . ibuprofen (ADVIL,MOTRIN) 200 MG tablet Take 800 mg by mouth every 6 (six) hours as needed (pain).    . metoprolol succinate (TOPROL-XL) 100 MG 24 hr tablet Take 1 tablet (100 mg total) by mouth daily. Take with or immediately following a meal. 90 tablet 3  . olmesartan-hydrochlorothiazide (BENICAR HCT) 40-25 MG tablet Take 1  tablet by mouth daily. 30 tablet 5   No current facility-administered medications on file prior to visit.      Past Medical History:  Diagnosis Date  . Allergy    perennial  . Anemia    heavy menses  . Cellulitis of right lower leg 07/17/2014   07/2014   . Hypertension   . Impaired fasting glucose 2011   108  . Nonspecific elevation of levels of transaminase or lactic acid dehydrogenase (LDH) 2011   ALT 43   No Known Allergies  Social History   Socioeconomic History  . Marital status: Married    Spouse name: Not on file  . Number of children: Not on file  . Years of education: Not on file  . Highest education level: Not on file  Occupational History  . Not on file  Social Needs  . Financial resource strain: Not on file  . Food insecurity:    Worry: Not on file    Inability: Not on file  . Transportation needs:    Medical: Not on file    Non-medical: Not on file  Tobacco Use  . Smoking status: Never Smoker  . Smokeless tobacco: Never Used  Substance and Sexual Activity  . Alcohol use: No  . Drug use: No  . Sexual activity: Not on file  Lifestyle  . Physical activity:    Days per week: Not on file    Minutes per session: Not on file  . Stress: Not on file  Relationships  . Social connections:  Talks on phone: Not on file    Gets together: Not on file    Attends religious service: Not on file    Active member of club or organization: Not on file    Attends meetings of clubs or organizations: Not on file    Relationship status: Not on file  Other Topics Concern  . Not on file  Social History Narrative  . Not on file    Vitals:   10/07/17 1104  BP: (!) 172/80  Pulse: 92  Resp: 16  Temp: 99 F (37.2 C)  SpO2: 96%   Body mass index is 47.43 kg/m.   Physical Exam  Nursing note and vitals reviewed. Constitutional: She is oriented to person, place, and time. She appears well-developed. She does not appear ill. No distress.  HENT:  Head:  Normocephalic and atraumatic.  Eyes: Conjunctivae are normal.  Cardiovascular: Normal rate and regular rhythm.  Murmur (? soft RUSB and LUSB) heard. Respiratory: Effort normal. No respiratory distress.  GI: Soft. She exhibits no mass. There is no tenderness.  Musculoskeletal: She exhibits edema (1+ pitting LE edema,bilateral.).  Right knee: on inspection no effusion, erythema, or deformities. Valgus and varus stress normal,mild tenderness on medial aspect with valgus. Anterior and posterior drawer test negative. Patellar apprehension test negative. Mild crepitus. ROM with no significant limitation.  Neurological: She is alert and oriented to person, place, and time. She has normal strength. Gait normal.  Skin: Skin is warm. No rash noted. No erythema.  Psychiatric: She has a normal mood and affect.  Well groomed, good eye contact.     ASSESSMENT AND PLAN:  Ms. Rebecca was seen today for knee pain.  Diagnoses and all orders for this visit:  Right knee pain, unspecified chronicity  ? Knee OA. Because elevated BP I recommend avoiding oral NSAID's. Topical Voltaren gel qid and Tylenol 500 mg tid as needed. I do not think imaging is needed. Wt loss will help. Follow with PCP as needed. Letter for work to avoid certain activities that can aggravate problem.  -     diclofenac sodium (VOLTAREN) 1 % GEL; Apply 4 g topically 4 (four) times daily.  Essential hypertension  Not well controlled. Possible complications of elevated BP discussed. She does not want to have meds adjusted. BP check in 4 weeks. Instructed about warning signs.    Return in about 4 weeks (around 11/04/2017) for HTN PCP.      Hae Ahlers G. [place], MD  [facility]. [place] office.

## 2017-11-05 NOTE — Progress Notes (Signed)
Subjective:    Patient ID: Rebecca Foley, female    DOB: Nov 10, 1960, 57 y.o.   MRN: [id number]  HPI The patient is here for follow up.  Hypertension:  We increased her metoprolol 4 weeks ago. She is taking her medication daily. She is compliant with a low sodium diet.  She denies chest pain, palpitations, edema, shortness of breath and regular headaches. She is not exercising regularly due to her knee pain.  She does not monitor her blood pressure at home, but states she can have it checked at work.    She has some allergy symptoms - drainage and cough.  She has not taken anything for them.    Medications and allergies reviewed with patient and updated if appropriate.  Patient Active Problem List   Diagnosis Date Noted  . Acute meniscal tear of right knee 08/13/2015  . Recurrent right knee instability 08/13/2015  . Pes planus of both feet 08/13/2015  . Obesity 07/22/2015  . Prediabetes 07/22/2015  . Vitamin B12 deficiency anemia 07/21/2014  . Perennial allergic rhinitis with seasonal variation 07/08/2009  . COLONIC POLYPS 07/03/2007  . KERATOCONUS 07/03/2007  . Essential hypertension 07/03/2007    Current Outpatient Medications on File Prior to Visit  Medication Sig Dispense Refill  . amLODipine (NORVASC) 5 MG tablet Take 1 tablet (5 mg total) by mouth daily. 90 tablet 3  . diclofenac sodium (VOLTAREN) 1 % GEL Apply 4 g topically 4 (four) times daily. 4 Tube 0  . ibuprofen (ADVIL,MOTRIN) 200 MG tablet Take 800 mg by mouth every 6 (six) hours as needed (pain).    . metoprolol succinate (TOPROL-XL) 100 MG 24 hr tablet Take 1 tablet (100 mg total) by mouth daily. Take with or immediately following a meal. 90 tablet 3  . olmesartan-hydrochlorothiazide (BENICAR HCT) 40-25 MG tablet Take 1 tablet by mouth daily. 30 tablet 5   No current facility-administered medications on file prior to visit.     Past Medical History:  Diagnosis Date  . Allergy    perennial  . Anemia    heavy menses  . Cellulitis of right lower leg 07/17/2014   07/2014   . Hypertension   . Impaired fasting glucose 2011   108  . Nonspecific elevation of levels of transaminase or lactic acid dehydrogenase (LDH) 2011   ALT 43    Past Surgical History:  Procedure Laterality Date  . CESAREAN SECTION    . COLONOSCOPY  2009 & 2011   polyps 2009; negative 2011  . DILATION AND CURETTAGE OF UTERUS    . INCISION AND DRAINAGE ABSCESS Right 06/17/2014   Procedure: INCISION AND DRAINAGE DEBRIDEMENT OF CHRONIC RIGH BREAST ABSCESS;  Surgeon: Greer Pickerel, MD;  Location: [place];  Service: General;  Laterality: Right;  . incision and drainage of left breast  12/2013  . thumb sugery     trigger  . TUBAL LIGATION      Social History   Socioeconomic History  . Marital status: Married    Spouse name: Not on file  . Number of children: Not on file  . Years of education: Not on file  . Highest education level: Not on file  Occupational History  . Not on file  Social Needs  . Financial resource strain: Not on file  . Food insecurity:    Worry: Not on file    Inability: Not on file  . Transportation needs:    Medical: Not on file    Non-medical: Not  on file  Tobacco Use  . Smoking status: Never Smoker  . Smokeless tobacco: Never Used  Substance and Sexual Activity  . Alcohol use: No  . Drug use: No  . Sexual activity: Not on file  Lifestyle  . Physical activity:    Days per week: Not on file    Minutes per session: Not on file  . Stress: Not on file  Relationships  . Social connections:    Talks on phone: Not on file    Gets together: Not on file    Attends religious service: Not on file    Active member of club or organization: Not on file    Attends meetings of clubs or organizations: Not on file    Relationship status: Not on file  Other Topics Concern  . Not on file  Social History Narrative  . Not on file    Family History  Problem Relation Age of Onset  . Hypertension  Mother   . Hypertension Father   . Stroke Father 78  . Heart disease Father        CM  . Alcohol abuse Brother   . Cancer Brother        ? prostate; PMH of Agent Orange exposure in [place]  . Heart attack Neg Hx     Review of Systems  Constitutional: Negative for chills and fever.  HENT: Positive for postnasal drip and sore throat (minimal).   Respiratory: Positive for cough (allegy related). Negative for shortness of breath and wheezing.   Cardiovascular: Negative for chest pain, palpitations and leg swelling.  Neurological: Negative for light-headedness and headaches.       Objective:   Vitals:   11/06/17 0814  BP: (!) 146/82  Pulse: 94  Resp: 16  Temp: 98.4 F (36.9 C)  SpO2: 95%   BP Readings from Last 3 Encounters:  11/06/17 (!) 146/82  10/07/17 (!) 172/80  09/15/17 (!) 152/68   Wt Readings from Last 3 Encounters:  11/06/17 286 lb (129.7 kg)  10/07/17 285 lb (129.3 kg)  09/15/17 284 lb (128.8 kg)   Body mass index is 47.59 kg/m.   Physical Exam    Constitutional: Appears well-developed and well-nourished. No distress.  HENT:  Head: Normocephalic and atraumatic.  Neck: Neck supple. No tracheal deviation present. No thyromegaly present.  No cervical lymphadenopathy Cardiovascular: Normal rate, regular rhythm and normal heart sounds.   No murmur heard. No carotid bruit .  No edema Pulmonary/Chest: Effort normal and breath sounds normal. No respiratory distress. No has no wheezes. No rales.  Skin: Skin is warm and dry. Not diaphoretic.  Psychiatric: Normal mood and affect. Behavior is normal.      Assessment & Plan:    See Problem List for Assessment and Plan of chronic medical problems.

## 2017-11-05 NOTE — Patient Instructions (Addendum)
Monitor your BP at work - it should be in the 120-130's/ 60-80's.   Let me know if it is higher.  Work on increasing exercise and weight loss.    Medications reviewed and updated.  Changes include :  flonase nasal spray, valtrex for cold sores as needed.    Please followup in 6 months     Hypertension Hypertension, commonly called high blood pressure, is when the force of blood pumping through the arteries is too strong. The arteries are the blood vessels that carry blood from the heart throughout the body. Hypertension forces the heart to work harder to pump blood and may cause arteries to become narrow or stiff. Having untreated or uncontrolled hypertension can cause heart attacks, strokes, kidney disease, and other problems. A blood pressure reading consists of a higher number over a lower number. Ideally, your blood pressure should be below 120/80. The first ("top") number is called the systolic pressure. It is a measure of the pressure in your arteries as your heart beats. The second ("bottom") number is called the diastolic pressure. It is a measure of the pressure in your arteries as the heart relaxes. What are the causes? The cause of this condition is not known. What increases the risk? Some risk factors for high blood pressure are under your control. Others are not. Factors you can change  Smoking.  Having type 2 diabetes mellitus, high cholesterol, or both.  Not getting enough exercise or physical activity.  Being overweight.  Having too much fat, sugar, calories, or salt (sodium) in your diet.  Drinking too much alcohol. Factors that are difficult or impossible to change  Having chronic kidney disease.  Having a family history of high blood pressure.  Age. Risk increases with age.  Race. You may be at higher risk if you are African-American.  Gender. Men are at higher risk than women before age 77. After age 25, women are at higher risk than men.  Having  obstructive sleep apnea.  Stress. What are the signs or symptoms? Extremely high blood pressure (hypertensive crisis) may cause:  Headache.  Anxiety.  Shortness of breath.  Nosebleed.  Nausea and vomiting.  Severe chest pain.  Jerky movements you cannot control (seizures).  How is this diagnosed? This condition is diagnosed by measuring your blood pressure while you are seated, with your arm resting on a surface. The cuff of the blood pressure monitor will be placed directly against the skin of your upper arm at the level of your heart. It should be measured at least twice using the same arm. Certain conditions can cause a difference in blood pressure between your right and left arms. Certain factors can cause blood pressure readings to be lower or higher than normal (elevated) for a short period of time:  When your blood pressure is higher when you are in a health care provider's office than when you are at home, this is called white coat hypertension. Most people with this condition do not need medicines.  When your blood pressure is higher at home than when you are in a health care provider's office, this is called masked hypertension. Most people with this condition may need medicines to control blood pressure.  If you have a high blood pressure reading during one visit or you have normal blood pressure with other risk factors:  You may be asked to return on a different day to have your blood pressure checked again.  You may be asked to monitor your  blood pressure at home for 1 week or longer.  If you are diagnosed with hypertension, you may have other blood or imaging tests to help your health care provider understand your overall risk for other conditions. How is this treated? This condition is treated by making healthy lifestyle changes, such as eating healthy foods, exercising more, and reducing your alcohol intake. Your health care provider may prescribe medicine if  lifestyle changes are not enough to get your blood pressure under control, and if:  Your systolic blood pressure is above 130.  Your diastolic blood pressure is above 80.  Your personal target blood pressure may vary depending on your medical conditions, your age, and other factors. Follow these instructions at home: Eating and drinking  Eat a diet that is high in fiber and potassium, and low in sodium, added sugar, and fat. An example eating plan is called the DASH (Dietary Approaches to Stop Hypertension) diet. To eat this way: ? Eat plenty of fresh fruits and vegetables. Try to fill half of your plate at each meal with fruits and vegetables. ? Eat whole grains, such as whole wheat pasta, brown rice, or whole grain bread. Fill about one quarter of your plate with whole grains. ? Eat or drink low-fat dairy products, such as skim milk or low-fat yogurt. ? Avoid fatty cuts of meat, processed or cured meats, and poultry with skin. Fill about one quarter of your plate with lean proteins, such as fish, chicken without skin, beans, eggs, and tofu. ? Avoid premade and processed foods. These tend to be higher in sodium, added sugar, and fat.  Reduce your daily sodium intake. Most people with hypertension should eat less than 1,500 mg of sodium a day.  Limit alcohol intake to no more than 1 drink a day for nonpregnant women and 2 drinks a day for men. One drink equals 12 oz of beer, 5 oz of wine, or 1 oz of hard liquor. Lifestyle  Work with your health care provider to maintain a healthy body weight or to lose weight. Ask what an ideal weight is for you.  Get at least 30 minutes of exercise that causes your heart to beat faster (aerobic exercise) most days of the week. Activities may include walking, swimming, or biking.  Include exercise to strengthen your muscles (resistance exercise), such as pilates or lifting weights, as part of your weekly exercise routine. Try to do these types of exercises  for 30 minutes at least 3 days a week.  Do not use any products that contain nicotine or tobacco, such as cigarettes and e-cigarettes. If you need help quitting, ask your health care provider.  Monitor your blood pressure at home as told by your health care provider.  Keep all follow-up visits as told by your health care provider. This is important. Medicines  Take over-the-counter and prescription medicines only as told by your health care provider. Follow directions carefully. Blood pressure medicines must be taken as prescribed.  Do not skip doses of blood pressure medicine. Doing this puts you at risk for problems and can make the medicine less effective.  Ask your health care provider about side effects or reactions to medicines that you should watch for. Contact a health care provider if:  You think you are having a reaction to a medicine you are taking.  You have headaches that keep coming back (recurring).  You feel dizzy.  You have swelling in your ankles.  You have trouble with your vision. Get  help right away if:  You develop a severe headache or confusion.  You have unusual weakness or numbness.  You feel faint.  You have severe pain in your chest or abdomen.  You vomit repeatedly.  You have trouble breathing. Summary  Hypertension is when the force of blood pumping through your arteries is too strong. If this condition is not controlled, it may put you at risk for serious complications.  Your personal target blood pressure may vary depending on your medical conditions, your age, and other factors. For most people, a normal blood pressure is less than 120/80.  Hypertension is treated with lifestyle changes, medicines, or a combination of both. Lifestyle changes include weight loss, eating a healthy, low-sodium diet, exercising more, and limiting alcohol. This information is not intended to replace advice given to you by your health care provider. Make sure you  discuss any questions you have with your health care provider. Document Released: 01/17/2005 Document Revised: 12/16/2015 Document Reviewed: 12/16/2015 Elsevier Interactive Patient Education  [employer].

## 2017-11-06 ENCOUNTER — Ambulatory Visit: Payer: 59 | Admitting: Internal Medicine

## 2017-11-06 ENCOUNTER — Encounter: Payer: Self-pay | Admitting: Internal Medicine

## 2017-11-06 VITALS — BP 146/82 | HR 94 | Temp 98.4°F | Resp 16 | Ht 65.0 in | Wt 286.0 lb

## 2017-11-06 DIAGNOSIS — I10 Essential (primary) hypertension: Secondary | ICD-10-CM

## 2017-11-06 DIAGNOSIS — J302 Other seasonal allergic rhinitis: Secondary | ICD-10-CM | POA: Diagnosis not present

## 2017-11-06 DIAGNOSIS — J3089 Other allergic rhinitis: Secondary | ICD-10-CM

## 2017-11-06 MED ORDER — FLUTICASONE PROPIONATE 50 MCG/ACT NA SUSP: 2.0000 | Freq: Every day | NASAL | 6 refills | Status: DC

## 2017-11-06 MED ORDER — VITAMIN B-12 1000 MCG PO TABS: 1000.0000 ug | ORAL_TABLET | Freq: Every day | ORAL | Status: AC

## 2017-11-06 MED ORDER — VALACYCLOVIR HCL 1 G PO TABS: 2000.0000 mg | ORAL_TABLET | Freq: Two times a day (BID) | ORAL | 0 refills | Status: DC | PRN

## 2017-11-06 NOTE — Assessment & Plan Note (Signed)
flonase daily - rx sent to pharmacy Can take otc oral anti-histamine

## 2017-11-06 NOTE — Assessment & Plan Note (Signed)
Not ideally controlled Stressed exercise when able and weight loss - to avoid more medication She will have the nurse at work check her BP and she will let me know what she gets -- if elevated will try increasing amlodipine to 10 mg daily

## 2018-03-25 NOTE — Patient Instructions (Addendum)
  Tests ordered today. Your results will be released to [place] (or called to you) after review, usually within 72hours after test completion. If any changes need to be made, you will be notified at that same time.  No immunizations administered today.   Medications reviewed and updated.  Changes include :     Your prescription(s) have been submitted to your pharmacy. Please take as directed and contact our office if you believe you are having problem(s) with the medication(s).  A referral was ordered for   Please followup in 6 months

## 2018-03-25 NOTE — Progress Notes (Signed)
Subjective:    Patient ID: Rebecca Foley, female    DOB: [birth year], 58 y.o.   MRN: [id number]  HPI The patient is here for follow up.      Medications and allergies reviewed with patient and updated if appropriate.  Patient Active Problem List   Diagnosis Date Noted  . Acute meniscal tear of right knee 08/13/2015  . Recurrent right knee instability 08/13/2015  . Pes planus of both feet 08/13/2015  . Obesity 07/22/2015  . Prediabetes 07/22/2015  . Vitamin B12 deficiency anemia 07/21/2014  . Perennial allergic rhinitis with seasonal variation 07/08/2009  . COLONIC POLYPS 07/03/2007  . KERATOCONUS 07/03/2007  . Essential hypertension 07/03/2007    Current Outpatient Medications on File Prior to Visit  Medication Sig Dispense Refill  . amLODipine (NORVASC) 5 MG tablet Take 1 tablet (5 mg total) by mouth daily. 90 tablet 3  . diclofenac sodium (VOLTAREN) 1 % GEL Apply 4 g topically 4 (four) times daily. 4 Tube 0  . fluticasone (FLONASE) 50 MCG/ACT nasal spray Place 2 sprays into both nostrils daily. 16 g 6  . ibuprofen (ADVIL,MOTRIN) 200 MG tablet Take 800 mg by mouth every 6 (six) hours as needed (pain).    . metoprolol succinate (TOPROL-XL) 100 MG 24 hr tablet Take 1 tablet (100 mg total) by mouth daily. Take with or immediately following a meal. 90 tablet 3  . olmesartan-hydrochlorothiazide (BENICAR HCT) 40-25 MG tablet Take 1 tablet by mouth daily. 30 tablet 5  . valACYclovir (VALTREX) 1000 MG tablet Take 2 tablets (2,000 mg total) by mouth every 12 (twelve) hours as needed. Take for one day only for outbreak. 36 tablet 0  . vitamin B-12 (CYANOCOBALAMIN) 1000 MCG tablet Take 1 tablet (1,000 mcg total) by mouth daily.     No current facility-administered medications on file prior to visit.     Past Medical History:  Diagnosis Date  . Allergy    perennial  . Anemia    heavy menses  . Cellulitis of right lower leg 07/17/2014   07/2014   . Hypertension   . Impaired  fasting glucose 2011   108  . Nonspecific elevation of levels of transaminase or lactic acid dehydrogenase (LDH) 2011   ALT 43    Past Surgical History:  Procedure Laterality Date  . CESAREAN SECTION    . COLONOSCOPY  2009 & 2011   polyps 2009; negative 2011  . DILATION AND CURETTAGE OF UTERUS    . INCISION AND DRAINAGE ABSCESS Right 06/17/2014   Procedure: INCISION AND DRAINAGE DEBRIDEMENT OF CHRONIC RIGH BREAST ABSCESS;  Surgeon: Greer Pickerel, MD;  Location: [facility];  Service: General;  Laterality: Right;  . incision and drainage of left breast  12/2013  . thumb sugery     trigger  . TUBAL LIGATION      Social History   Socioeconomic History  . Marital status: Married    Spouse name: Not on file  . Number of children: Not on file  . Years of education: Not on file  . Highest education level: Not on file  Occupational History  . Not on file  Social Needs  . Financial resource strain: Not on file  . Food insecurity:    Worry: Not on file    Inability: Not on file  . Transportation needs:    Medical: Not on file    Non-medical: Not on file  Tobacco Use  . Smoking status: Never Smoker  . Smokeless tobacco:  Never Used  Substance and Sexual Activity  . Alcohol use: No  . Drug use: No  . Sexual activity: Not on file  Lifestyle  . Physical activity:    Days per week: Not on file    Minutes per session: Not on file  . Stress: Not on file  Relationships  . Social connections:    Talks on phone: Not on file    Gets together: Not on file    Attends religious service: Not on file    Active member of club or organization: Not on file    Attends meetings of clubs or organizations: Not on file    Relationship status: Not on file  Other Topics Concern  . Not on file  Social History Narrative  . Not on file    Family History  Problem Relation Age of Onset  . Hypertension Mother   . Hypertension Father   . Stroke Father 4  . Heart disease Father        CM  . Alcohol  abuse Brother   . Cancer Brother        ? prostate; PMH of Agent Orange exposure in [place]  . Heart attack Neg Hx     Review of Systems     Objective:  There were no vitals filed for this visit. BP Readings from Last 3 Encounters:  11/06/17 (!) 146/82  10/07/17 (!) 172/80  09/15/17 (!) 152/68   Wt Readings from Last 3 Encounters:  11/06/17 286 lb (129.7 kg)  10/07/17 285 lb (129.3 kg)  09/15/17 284 lb (128.8 kg)   There is no height or weight on file to calculate BMI.   Physical Exam        Assessment & Plan:    See Problem List for Assessment and Plan of chronic medical problems.   This encounter was created in error - please disregard.

## 2018-03-27 ENCOUNTER — Encounter: Payer: 59 | Admitting: Internal Medicine

## 2018-04-08 NOTE — Progress Notes (Signed)
Subjective:    Patient ID: Rebecca Foley, female    DOB: March 10, 1960, 58 y.o.   MRN: [id number]  HPI The patient is here for follow up.  Hypertension: She is taking her medication daily. She is compliant with a low sodium diet.  She denies chest pain, palpitations, edema, shortness of breath and regular headaches. She is not exercising regularly.  She does not monitor her blood pressure at home.    Prediabetes:  She is fairly compliant with a low sugar/carbohydrate diet.  She is not exercising regularly.  Obesity: She has gained weight since she was here last.  She is not currently exercising and knows she could do better with her eating habits.   Medications and allergies reviewed with patient and updated if appropriate.  Patient Active Problem List   Diagnosis Date Noted  . Acute meniscal tear of right knee 08/13/2015  . Recurrent right knee instability 08/13/2015  . Pes planus of both feet 08/13/2015  . Obesity 07/22/2015  . Prediabetes 07/22/2015  . Vitamin B12 deficiency anemia 07/21/2014  . Perennial allergic rhinitis with seasonal variation 07/08/2009  . COLONIC POLYPS 07/03/2007  . KERATOCONUS 07/03/2007  . Essential hypertension 07/03/2007    Current Outpatient Medications on File Prior to Visit  Medication Sig Dispense Refill  . amLODipine (NORVASC) 5 MG tablet Take 1 tablet (5 mg total) by mouth daily. 90 tablet 3  . diclofenac sodium (VOLTAREN) 1 % GEL Apply 4 g topically 4 (four) times daily. 4 Tube 0  . fluticasone (FLONASE) 50 MCG/ACT nasal spray Place 2 sprays into both nostrils daily. 16 g 6  . ibuprofen (ADVIL,MOTRIN) 200 MG tablet Take 800 mg by mouth every 6 (six) hours as needed (pain).    . metoprolol succinate (TOPROL-XL) 100 MG 24 hr tablet Take 1 tablet (100 mg total) by mouth daily. Take with or immediately following a meal. 90 tablet 3  . olmesartan-hydrochlorothiazide (BENICAR HCT) 40-25 MG tablet Take 1 tablet by mouth daily. 30 tablet 5  .  valACYclovir (VALTREX) 1000 MG tablet Take 2 tablets (2,000 mg total) by mouth every 12 (twelve) hours as needed. Take for one day only for outbreak. 36 tablet 0  . vitamin B-12 (CYANOCOBALAMIN) 1000 MCG tablet Take 1 tablet (1,000 mcg total) by mouth daily.     No current facility-administered medications on file prior to visit.     Past Medical History:  Diagnosis Date  . Allergy    perennial  . Anemia    heavy menses  . Cellulitis of right lower leg 07/17/2014   07/2014   . Hypertension   . Impaired fasting glucose 2011   108  . Nonspecific elevation of levels of transaminase or lactic acid dehydrogenase (LDH) 2011   ALT 43    Past Surgical History:  Procedure Laterality Date  . CESAREAN SECTION    . COLONOSCOPY  2009 & 2011   polyps 2009; negative 2011  . DILATION AND CURETTAGE OF UTERUS    . INCISION AND DRAINAGE ABSCESS Right 06/17/2014   Procedure: INCISION AND DRAINAGE DEBRIDEMENT OF CHRONIC RIGH BREAST ABSCESS;  Surgeon: Greer Pickerel, MD;  Location: [facility];  Service: General;  Laterality: Right;  . incision and drainage of left breast  12/2013  . thumb sugery     trigger  . TUBAL LIGATION      Social History   Socioeconomic History  . Marital status: Married    Spouse name: Not on file  . Number of  children: Not on file  . Years of education: Not on file  . Highest education level: Not on file  Occupational History  . Not on file  Social Needs  . Financial resource strain: Not on file  . Food insecurity:    Worry: Not on file    Inability: Not on file  . Transportation needs:    Medical: Not on file    Non-medical: Not on file  Tobacco Use  . Smoking status: Never Smoker  . Smokeless tobacco: Never Used  Substance and Sexual Activity  . Alcohol use: No  . Drug use: No  . Sexual activity: Not on file  Lifestyle  . Physical activity:    Days per week: Not on file    Minutes per session: Not on file  . Stress: Not on file  Relationships  . Social  connections:    Talks on phone: Not on file    Gets together: Not on file    Attends religious service: Not on file    Active member of club or organization: Not on file    Attends meetings of clubs or organizations: Not on file    Relationship status: Not on file  Other Topics Concern  . Not on file  Social History Narrative  . Not on file    Family History  Problem Relation Age of Onset  . Hypertension Mother   . Hypertension Father   . Stroke Father 72  . Heart disease Father        CM  . Alcohol abuse Brother   . Cancer Brother        ? prostate; PMH of Agent Orange exposure in [place]  . Heart attack Neg Hx     Review of Systems  Constitutional: Negative for chills and fever.  Respiratory: Negative for cough, shortness of breath and wheezing.   Cardiovascular: Negative for chest pain, palpitations and leg swelling.  Neurological: Negative for light-headedness and headaches.       Objective:   Vitals:   04/10/18 0755  BP: (!) 152/78  Pulse: 93  Resp: 16  Temp: 98.4 F (36.9 C)  SpO2: 99%   BP Readings from Last 3 Encounters:  04/10/18 (!) 152/78  11/06/17 (!) 146/82  10/07/17 (!) 172/80   Wt Readings from Last 3 Encounters:  04/10/18 295 lb (133.8 kg)  11/06/17 286 lb (129.7 kg)  10/07/17 285 lb (129.3 kg)   Body mass index is 49.09 kg/m.   Physical Exam    Constitutional: Appears well-developed and well-nourished. No distress.  HENT:  Head: Normocephalic and atraumatic.  Neck: Neck supple. No tracheal deviation present. No thyromegaly present.  No cervical lymphadenopathy Cardiovascular: Normal rate, regular rhythm and normal heart sounds.   No murmur heard. No carotid bruit .  No edema Pulmonary/Chest: Effort normal and breath sounds normal. No respiratory distress. No has no wheezes. No rales.  Skin: Skin is warm and dry. Not diaphoretic.  Psychiatric: Normal mood and affect. Behavior is normal.      Assessment & Plan:    See Problem List  for Assessment and Plan of chronic medical problems.

## 2018-04-10 ENCOUNTER — Ambulatory Visit: Payer: 59 | Admitting: Internal Medicine

## 2018-04-10 ENCOUNTER — Other Ambulatory Visit (INDEPENDENT_AMBULATORY_CARE_PROVIDER_SITE_OTHER): Payer: 59

## 2018-04-10 ENCOUNTER — Encounter: Payer: Self-pay | Admitting: Internal Medicine

## 2018-04-10 VITALS — BP 152/78 | HR 93 | Temp 98.4°F | Resp 16 | Ht 65.0 in | Wt 295.0 lb

## 2018-04-10 DIAGNOSIS — R7303 Prediabetes: Secondary | ICD-10-CM | POA: Diagnosis not present

## 2018-04-10 DIAGNOSIS — I10 Essential (primary) hypertension: Secondary | ICD-10-CM

## 2018-04-10 DIAGNOSIS — Z6841 Body Mass Index (BMI) 40.0 and over, adult: Secondary | ICD-10-CM | POA: Diagnosis not present

## 2018-04-10 DIAGNOSIS — E66813 Obesity, class 3: Secondary | ICD-10-CM

## 2018-04-10 LAB — COMPREHENSIVE METABOLIC PANEL
ALT: 34 U/L (ref 0–35)
AST: 23 U/L (ref 0–37)
Albumin: 4.2 g/dL (ref 3.5–5.2)
Alkaline Phosphatase: 52 U/L (ref 39–117)
BUN: 13 mg/dL (ref 6–23)
CO2: 26 meq/L (ref 19–32)
Calcium: 10.2 mg/dL (ref 8.4–10.5)
Chloride: 102 mEq/L (ref 96–112)
Creatinine, Ser: 0.82 mg/dL (ref 0.40–1.20)
GFR: 86.08 mL/min (ref >60.00)
Glucose, Bld: 104 mg/dL — ABNORMAL HIGH (ref 70–99)
Potassium: 3.6 mEq/L (ref 3.5–5.1)
Sodium: 138 mEq/L (ref 135–145)
Total Bilirubin: 0.4 mg/dL (ref 0.2–1.2)
Total Protein: 7.5 g/dL (ref 6.0–8.3)

## 2018-04-10 LAB — HEMOGLOBIN A1C: HEMOGLOBIN A1C: 6.2 % (ref 4.6–6.5)

## 2018-04-10 MED ORDER — OLMESARTAN MEDOXOMIL-HCTZ 40-25 MG PO TABS: 1.0000 | ORAL_TABLET | Freq: Every day | ORAL | 1 refills | Status: DC

## 2018-04-10 MED ORDER — LEVOCETIRIZINE DIHYDROCHLORIDE 5 MG PO TABS: 5.0000 mg | ORAL_TABLET | Freq: Every evening | ORAL | 5 refills | Status: DC

## 2018-04-10 MED ORDER — VALACYCLOVIR HCL 1 G PO TABS: 2000.0000 mg | ORAL_TABLET | Freq: Two times a day (BID) | ORAL | 0 refills | Status: DC | PRN

## 2018-04-10 MED ORDER — AMLODIPINE BESYLATE 10 MG PO TABS: 10.0000 mg | ORAL_TABLET | Freq: Every day | ORAL | 3 refills | Status: DC

## 2018-04-10 NOTE — Assessment & Plan Note (Signed)
Decrease portions Increase exercise Discussed nutrition referral Lean protein, veges, fruit

## 2018-04-10 NOTE — Patient Instructions (Addendum)
  Tests ordered today. Your results will be released to [facility] (or called to you) after review, usually within 72hours after test completion. If any changes need to be made, you will be notified at that same time.   Medications reviewed and updated.  Changes include :   Increase amlodipine to 10 mg daily.   Your prescription(s) have been submitted to your pharmacy. Please take as directed and contact our office if you believe you are having problem(s) with the medication(s).   Please followup in 6 months

## 2018-04-10 NOTE — Assessment & Plan Note (Addendum)
Check a1c Low sugar / carb diet Stressed regular exercise Advised weight loss  

## 2018-04-10 NOTE — Assessment & Plan Note (Addendum)
BP not controlled Increase amlodipine to 10 mg daily-she will let me know if this causes increased leg edema and if so we will need to adjust medication Continue other medications cmp Start monitoring blood pressure at home-discussed goal blood pressure Discussed consequences of uncontrolled blood pressure

## 2018-04-11 ENCOUNTER — Encounter: Payer: Self-pay | Admitting: Internal Medicine

## 2018-04-16 ENCOUNTER — Ambulatory Visit: Payer: Self-pay

## 2018-04-16 MED ORDER — MELOXICAM 15 MG PO TABS: 15.0000 mg | ORAL_TABLET | Freq: Every day | ORAL | 0 refills | Status: DC

## 2018-04-16 NOTE — Addendum Note (Signed)
Addended by: Binnie Rail on: 04/16/2018 04:35 PM   Modules accepted: Orders

## 2018-04-16 NOTE — Telephone Encounter (Signed)
I can send in meloxicam, which is a stronger anti-inflammatory than ibuprofen.  If she takes this, which is once a day she cannot take any ibuprofen while taking it.  She can also ice the knee and apply diclofenac gel.  If pain is not improving or if this not effective she should see sports medicine.  Send back to me if she wants medication sent in

## 2018-04-16 NOTE — Telephone Encounter (Signed)
Pt stated that she fell on her knees 2 weeks ago and this past Thursday, her left knee is sore. Pt stated that her pain is located to the lateral aspect of the left knee and on the side of the leg. Pt stated that pain is worse when she is laying in bed. Pt rated pain goes to 7-8 at night. Pt stated the soreness is constant. Pt denies swelling or redness to knee.  Care advice given to pt and pt verbalized understanding. Pt is wanting something stronger than Tylenol or ibuprofen called in. Attempted to make appt but pt is refusing to come in to office due to potential contact with cough or flu.  Advised pt will need appointment for evaluation. Pt refused. Routing note to office.   Reason for Disposition . [1] MODERATE pain (e.g., interferes with normal activities, limping) AND [2] present > 3 days  Answer Assessment - Initial Assessment Questions 1. LOCATION and RADIATION: "Where is the pain located?"      Left knee  On the lateral knee and side of leg  2. QUALITY: "What does the pain feel like?"  (e.g., sharp, dull, aching, burning)     Sore  3. SEVERITY: "How bad is the pain?" "What does it keep you from doing?"   (Scale 1-10; or mild, moderate, severe)   -  MILD (1-3): doesn't interfere with normal activities    -  MODERATE (4-7): interferes with normal activities (e.g., work or school) or awakens from sleep, limping    -  SEVERE (8-10): excruciating pain, unable to do any normal activities, unable to walk     At night mainly 7-8 when active pain is not as bad- sleeping- sore  4. ONSET: "When did the pain start?" "Does it come and go, or is it there all the time?"    Thursday night - constant 5. RECURRENT: "Have you had this pain before?" If so, ask: "When, and what happened then?"     no 6. SETTING: "Has there been any recent work, exercise or other activity that involved that part of the body?"      Fell 2 weeks ago on your knees left greater than right 7. AGGRAVATING FACTORS: "What makes  the knee pain worse?" (e.g., walking, climbing stairs, running)     Laying down at night to sleep makes pain worse 8. ASSOCIATED SYMPTOMS: "Is there any swelling or redness of the knee?"     no 9. OTHER SYMPTOMS: "Do you have any other symptoms?" (e.g., chest pain, difficulty breathing, fever, calf pain)     no 10. PREGNANCY: "Is there any chance you are pregnant?" "When was your last menstrual period?"       n/a  Protocols used: KNEE PAIN-A-AH

## 2018-04-16 NOTE — Telephone Encounter (Signed)
Pt wants to try meloxicam. Send to [facility] on [address].

## 2018-04-16 NOTE — Telephone Encounter (Signed)
Pt wants something to help with knee pain but refusing appointment due to sickness in the office. Please advise.

## 2018-05-06 ENCOUNTER — Other Ambulatory Visit: Payer: Self-pay | Admitting: Internal Medicine

## 2018-05-15 ENCOUNTER — Encounter: Payer: 59 | Admitting: Internal Medicine

## 2018-05-17 ENCOUNTER — Other Ambulatory Visit: Payer: Self-pay | Admitting: Internal Medicine

## 2018-06-08 ENCOUNTER — Other Ambulatory Visit: Payer: Self-pay | Admitting: Internal Medicine

## 2018-06-11 ENCOUNTER — Other Ambulatory Visit: Payer: Self-pay | Admitting: Internal Medicine

## 2018-09-06 ENCOUNTER — Other Ambulatory Visit: Payer: Self-pay | Admitting: Internal Medicine

## 2018-09-15 ENCOUNTER — Other Ambulatory Visit: Payer: Self-pay | Admitting: Internal Medicine

## 2018-10-16 ENCOUNTER — Encounter: Payer: 59 | Admitting: Internal Medicine

## 2018-11-07 NOTE — Patient Instructions (Addendum)
Call and schedule your mammogram -  [facility] 7 a.m.-6:30 p.m., Monday 7 a.m.-5 p.m., Tuesday-Friday Schedule an appointment by calling [phone]    Tests ordered today. Your results will be released to [place] (or called to you) after review.  If any changes need to be made, you will be notified at that same time.  All other Health Maintenance issues reviewed.   All recommended immunizations and age-appropriate screenings are up-to-date or discussed.  No immunization administered today.   Medications reviewed and updated.  Changes include :   none  Your prescription(s) have been submitted to your pharmacy. Please take as directed and contact our office if you believe you are having problem(s) with the medication(s).   Please followup in 6 months    Health Maintenance, Female Adopting a healthy lifestyle and getting preventive care are important in promoting health and wellness. Ask your health care provider about:  The right schedule for you to have regular tests and exams.  Things you can do on your own to prevent diseases and keep yourself healthy. What should I know about diet, weight, and exercise? Eat a healthy diet   Eat a diet that includes plenty of vegetables, fruits, low-fat dairy products, and lean protein.  Do not eat a lot of foods that are high in solid fats, added sugars, or sodium. Maintain a healthy weight Body mass index (BMI) is used to identify weight problems. It estimates body fat based on height and weight. Your health care provider can help determine your BMI and help you achieve or maintain a healthy weight. Get regular exercise Get regular exercise. This is one of the most important things you can do for your health. Most adults should:  Exercise for at least 150 minutes each week. The exercise should increase your heart rate and make you sweat (moderate-intensity exercise).  Do strengthening exercises at least  twice a week. This is in addition to the moderate-intensity exercise.  Spend less time sitting. Even light physical activity can be beneficial. Watch cholesterol and blood lipids Have your blood tested for lipids and cholesterol at 60 years of age, then have this test every 5 years. Have your cholesterol levels checked more often if:  Your lipid or cholesterol levels are high.  You are older than 60 years of age.  You are at high risk for heart disease. What should I know about cancer screening? Depending on your health history and family history, you may need to have cancer screening at various ages. This may include screening for:  Breast cancer.  Cervical cancer.  Colorectal cancer.  Skin cancer.  Lung cancer. What should I know about heart disease, diabetes, and high blood pressure? Blood pressure and heart disease  High blood pressure causes heart disease and increases the risk of stroke. This is more likely to develop in people who have high blood pressure readings, are of African descent, or are overweight.  Have your blood pressure checked: ? Every 3-5 years if you are 81-55 years of age. ? Every year if you are 102 years old or older. Diabetes Have regular diabetes screenings. This checks your fasting blood sugar level. Have the screening done:  Once every three years after age 69 if you are at a normal weight and have a low risk for diabetes.  More often and at a younger age if you are overweight or have a high risk for diabetes. What should I know about preventing infection? Hepatitis  B If you have a higher risk for hepatitis B, you should be screened for this virus. Talk with your health care provider to find out if you are at risk for hepatitis B infection. Hepatitis C Testing is recommended for:  Everyone born from [birth year] through 1967.  Anyone with known risk factors for hepatitis C. Sexually transmitted infections (STIs)  Get screened for STIs, including  gonorrhea and chlamydia, if: ? You are sexually active and are younger than 60 years of age. ? You are older than 59 years of age and your health care provider tells you that you are at risk for this type of infection. ? Your sexual activity has changed since you were last screened, and you are at increased risk for chlamydia or gonorrhea. Ask your health care provider if you are at risk.  Ask your health care provider about whether you are at high risk for HIV. Your health care provider may recommend a prescription medicine to help prevent HIV infection. If you choose to take medicine to prevent HIV, you should first get tested for HIV. You should then be tested every 3 months for as long as you are taking the medicine. Pregnancy  If you are about to stop having your period (premenopausal) and you may become pregnant, seek counseling before you get pregnant.  Take 400 to 800 micrograms (mcg) of folic acid every day if you become pregnant.  Ask for birth control (contraception) if you want to prevent pregnancy. Osteoporosis and menopause Osteoporosis is a disease in which the bones lose minerals and strength with aging. This can result in bone fractures. If you are 31 years old or older, or if you are at risk for osteoporosis and fractures, ask your health care provider if you should:  Be screened for bone loss.  Take a calcium or vitamin D supplement to lower your risk of fractures.  Be given hormone replacement therapy (HRT) to treat symptoms of menopause. Follow these instructions at home: Lifestyle  Do not use any products that contain nicotine or tobacco, such as cigarettes, e-cigarettes, and chewing tobacco. If you need help quitting, ask your health care provider.  Do not use street drugs.  Do not share needles.  Ask your health care provider for help if you need support or information about quitting drugs. Alcohol use  Do not drink alcohol if: ? Your health care provider  tells you not to drink. ? You are pregnant, may be pregnant, or are planning to become pregnant.  If you drink alcohol: ? Limit how much you use to 0-1 drink a day. ? Limit intake if you are breastfeeding.  Be aware of how much alcohol is in your drink. In the [place] one drink equals one 12 oz bottle of beer (355 mL), one 5 oz glass of wine (148 mL), or one 1 oz glass of hard liquor (44 mL). General instructions  Schedule regular health, dental, and eye exams.  Stay current with your vaccines.  Tell your health care provider if: ? You often feel depressed. ? You have ever been abused or do not feel safe at home. Summary  Adopting a healthy lifestyle and getting preventive care are important in promoting health and wellness.  Follow your health care provider's instructions about healthy diet, exercising, and getting tested or screened for diseases.  Follow your health care provider's instructions on monitoring your cholesterol and blood pressure. This information is not intended to replace advice given to you by your health  care provider. Make sure you discuss any questions you have with your health care provider. Document Released: 08/02/2010 Document Revised: 01/10/2018 Document Reviewed: 01/10/2018 Elsevier Patient Education  2020 [employer].

## 2018-11-07 NOTE — Progress Notes (Signed)
Subjective:    Patient ID: Rebecca Foley, female    DOB: August 08, 1960, 58 y.o.   MRN: [id number]  HPI She is here for a physical exam.   She has been out of the amlodipine for 1-2 weeks.  She is taking the other medications.  She has not checked her BP at home recently.     Medications and allergies reviewed with patient and updated if appropriate.  Patient Active Problem List   Diagnosis Date Noted  . Acute meniscal tear of right knee 08/13/2015  . Recurrent right knee instability 08/13/2015  . Pes planus of both feet 08/13/2015  . Obesity 07/22/2015  . Prediabetes 07/22/2015  . Vitamin B12 deficiency anemia 07/21/2014  . Perennial allergic rhinitis with seasonal variation 07/08/2009  . COLONIC POLYPS 07/03/2007  . KERATOCONUS 07/03/2007  . Essential hypertension 07/03/2007    Current Outpatient Medications on File Prior to Visit  Medication Sig Dispense Refill  . amLODipine (NORVASC) 10 MG tablet Take 1 tablet (10 mg total) by mouth daily. 90 tablet 3  . diclofenac sodium (VOLTAREN) 1 % GEL Apply 4 g topically 4 (four) times daily. 4 Tube 0  . fluticasone (FLONASE) 50 MCG/ACT nasal spray Place 2 sprays into both nostrils daily. 16 g 6  . ibuprofen (ADVIL,MOTRIN) 200 MG tablet Take 800 mg by mouth every 6 (six) hours as needed (pain).    Marland Kitchen levocetirizine (XYZAL) 5 MG tablet TAKE 1 TABLET BY MOUTH EVERY DAY IN THE EVENING 90 tablet 1  . meloxicam (MOBIC) 15 MG tablet TAKE 1 TABLET BY MOUTH EVERY DAY 90 tablet 0  . metoprolol succinate (TOPROL-XL) 100 MG 24 hr tablet TAKE 1 TABLET (100 MG TOTAL) BY MOUTH DAILY. TAKE WITH OR IMMEDIATELY FOLLOWING A MEAL. 90 tablet 1  . olmesartan-hydrochlorothiazide (BENICAR HCT) 40-25 MG tablet Take 1 tablet by mouth daily. 90 tablet 1  . valACYclovir (VALTREX) 1000 MG tablet TAKE 2 TABLETS (2,000 MG TOTAL) BY MOUTH EVERY 12 (TWELVE) HOURS AS NEEDED. TAKE FOR ONE DAY ONLY FOR OUTBREAK. 36 tablet 0  . vitamin B-12 (CYANOCOBALAMIN) 1000 MCG  tablet Take 1 tablet (1,000 mcg total) by mouth daily.     No current facility-administered medications on file prior to visit.     Past Medical History:  Diagnosis Date  . Allergy    perennial  . Anemia    heavy menses  . Cellulitis of right lower leg 07/17/2014   07/2014   . Hypertension   . Impaired fasting glucose 2011   108  . Nonspecific elevation of levels of transaminase or lactic acid dehydrogenase (LDH) 2011   ALT 43    Past Surgical History:  Procedure Laterality Date  . CESAREAN SECTION    . COLONOSCOPY  2009 & 2011   polyps 2009; negative 2011  . DILATION AND CURETTAGE OF UTERUS    . INCISION AND DRAINAGE ABSCESS Right 06/17/2014   Procedure: INCISION AND DRAINAGE DEBRIDEMENT OF CHRONIC RIGH BREAST ABSCESS;  Surgeon: Greer Pickerel, MD;  Location: [facility];  Service: General;  Laterality: Right;  . incision and drainage of left breast  12/2013  . thumb sugery     trigger  . TUBAL LIGATION      Social History   Socioeconomic History  . Marital status: Married    Spouse name: Not on file  . Number of children: Not on file  . Years of education: Not on file  . Highest education level: Not on file  Occupational History  .  Not on file  Social Needs  . Financial resource strain: Not on file  . Food insecurity    Worry: Not on file    Inability: Not on file  . Transportation needs    Medical: Not on file    Non-medical: Not on file  Tobacco Use  . Smoking status: Never Smoker  . Smokeless tobacco: Never Used  Substance and Sexual Activity  . Alcohol use: No  . Drug use: No  . Sexual activity: Not on file  Lifestyle  . Physical activity    Days per week: Not on file    Minutes per session: Not on file  . Stress: Not on file  Relationships  . Social Herbalist on phone: Not on file    Gets together: Not on file    Attends religious service: Not on file    Active member of club or organization: Not on file    Attends meetings of clubs or  organizations: Not on file    Relationship status: Not on file  Other Topics Concern  . Not on file  Social History Narrative  . Not on file    Family History  Problem Relation Age of Onset  . Hypertension Mother   . Hypertension Father   . Stroke Father 82  . Heart disease Father        CM  . Alcohol abuse Brother   . Cancer Brother        ? prostate; PMH of Agent Orange exposure in [place]  . Heart attack Neg Hx     Review of Systems  Constitutional: Negative for chills and fever.  Eyes: Negative for visual disturbance.  Respiratory: Negative for cough, shortness of breath and wheezing.   Cardiovascular: Negative for chest pain, palpitations and leg swelling.  Gastrointestinal: Negative for abdominal pain, blood in stool, constipation, diarrhea and nausea.       No gerd  Genitourinary: Negative for dysuria and hematuria.  Musculoskeletal: Positive for arthralgias (knee better). Negative for back pain.  Skin: Negative for color change and rash.  Neurological: Negative for dizziness, light-headedness and headaches.  Psychiatric/Behavioral: Negative for dysphoric mood. The patient is not nervous/anxious.        Objective:   Vitals:   11/08/18 0800  BP: (!) 180/90  Pulse: 96  Temp: 98.4 F (36.9 C)  SpO2: 98%   Filed Weights   11/08/18 0800  Weight: 290 lb (131.5 kg)   Body mass index is 48.26 kg/m.  BP Readings from Last 3 Encounters:  11/08/18 (!) 180/90  04/10/18 (!) 152/78  11/06/17 (!) 146/82    Wt Readings from Last 3 Encounters:  11/08/18 290 lb (131.5 kg)  04/10/18 295 lb (133.8 kg)  11/06/17 286 lb (129.7 kg)     Physical Exam Constitutional: She appears well-developed and well-nourished. No distress.  HENT:  Head: Normocephalic and atraumatic.  Right Ear: External ear normal. Normal ear canal and TM Left Ear: External ear normal.  Normal ear canal and TM Mouth/Throat: Oropharynx is clear and moist.  Eyes: Conjunctivae and EOM are normal.   Neck: Neck supple. No tracheal deviation present. No thyromegaly present.  No carotid bruit  Cardiovascular: Normal rate, regular rhythm and normal heart sounds.   No murmur heard.  No edema. Pulmonary/Chest: Effort normal and breath sounds normal. No respiratory distress. She has no wheezes. She has no rales.  Breast: deferred   Abdominal: Soft. She exhibits no distension. There is no tenderness.  Lymphadenopathy: She has no cervical adenopathy.  Skin: Skin is warm and dry. She is not diaphoretic.  Psychiatric: She has a normal mood and affect. Her behavior is normal.        Assessment & Plan:   Physical exam: Screening blood work    ordered Immunizations  Advised flu vaccine - deferred, discussed shingrix Colonoscopy  Up to date  Mammogram  Due - will schedule Gyn  Not up to date - advised to schedule Eye exams    Goes every 2 years, up to date Exercise   none Weight  Stressed weight loss Substance abuse  none  See Problem List for Assessment and Plan of chronic medical problems.   FU in 6 months

## 2018-11-08 ENCOUNTER — Ambulatory Visit (INDEPENDENT_AMBULATORY_CARE_PROVIDER_SITE_OTHER): Payer: 59 | Admitting: Internal Medicine

## 2018-11-08 ENCOUNTER — Other Ambulatory Visit (INDEPENDENT_AMBULATORY_CARE_PROVIDER_SITE_OTHER): Payer: 59

## 2018-11-08 ENCOUNTER — Encounter: Payer: Self-pay | Admitting: Internal Medicine

## 2018-11-08 ENCOUNTER — Other Ambulatory Visit: Payer: Self-pay

## 2018-11-08 VITALS — BP 180/90 | HR 96 | Temp 98.4°F | Ht 65.0 in | Wt 290.0 lb

## 2018-11-08 DIAGNOSIS — R7303 Prediabetes: Secondary | ICD-10-CM

## 2018-11-08 DIAGNOSIS — Z0000 Encounter for general adult medical examination without abnormal findings: Secondary | ICD-10-CM | POA: Diagnosis not present

## 2018-11-08 DIAGNOSIS — D519 Vitamin B12 deficiency anemia, unspecified: Secondary | ICD-10-CM | POA: Diagnosis not present

## 2018-11-08 DIAGNOSIS — Z6841 Body Mass Index (BMI) 40.0 and over, adult: Secondary | ICD-10-CM

## 2018-11-08 DIAGNOSIS — I10 Essential (primary) hypertension: Secondary | ICD-10-CM

## 2018-11-08 LAB — CBC WITH DIFFERENTIAL/PLATELET
Basophils Absolute: 0 10*3/uL (ref 0.0–0.1)
Basophils Relative: 0.5 % (ref 0.0–3.0)
Eosinophils Absolute: 0.2 10*3/uL (ref 0.0–0.7)
Eosinophils Relative: 2.6 % (ref 0.0–5.0)
HCT: 36.7 % (ref 36.0–46.0)
Hemoglobin: 12 g/dL (ref 12.0–15.0)
Lymphocytes Relative: 28.1 % (ref 12.0–46.0)
Lymphs Abs: 2.3 10*3/uL (ref 0.7–4.0)
MCHC: 32.8 g/dL (ref 30.0–36.0)
MCV: 86.2 fl (ref 78.0–100.0)
Monocytes Absolute: 0.7 10*3/uL (ref 0.1–1.0)
Monocytes Relative: 8 % (ref 3.0–12.0)
Neutro Abs: 4.9 10*3/uL (ref 1.4–7.7)
Neutrophils Relative %: 60.8 % (ref 43.0–77.0)
Platelets: 382 10*3/uL (ref 150.0–400.0)
RBC: 4.26 Mil/uL (ref 3.87–5.11)
RDW: 15 % (ref 11.5–15.5)
WBC: 8.1 10*3/uL (ref 4.0–10.5)

## 2018-11-08 LAB — COMPREHENSIVE METABOLIC PANEL
ALT: 38 U/L — ABNORMAL HIGH (ref 0–35)
AST: 28 U/L (ref 0–37)
Albumin: 4.1 g/dL (ref 3.5–5.2)
Alkaline Phosphatase: 61 U/L (ref 39–117)
BUN: 18 mg/dL (ref 6–23)
CO2: 27 mEq/L (ref 19–32)
Calcium: 11.3 mg/dL — ABNORMAL HIGH (ref 8.4–10.5)
Chloride: 102 mEq/L (ref 96–112)
Creatinine, Ser: 0.81 mg/dL (ref 0.40–1.20)
GFR: 87.13 mL/min (ref >60.00)
Glucose, Bld: 99 mg/dL (ref 70–99)
Potassium: 3.7 mEq/L (ref 3.5–5.1)
Sodium: 139 mEq/L (ref 135–145)
Total Bilirubin: 0.3 mg/dL (ref 0.2–1.2)
Total Protein: 7.9 g/dL (ref 6.0–8.3)

## 2018-11-08 LAB — LIPID PANEL
Cholesterol: 136 mg/dL (ref 0–200)
HDL: 42.5 mg/dL (ref >39.00)
LDL Cholesterol: 75 mg/dL (ref 0–99)
NonHDL: 93.11
Total CHOL/HDL Ratio: 3
Triglycerides: 92 mg/dL (ref 0.0–149.0)
VLDL: 18.4 mg/dL (ref 0.0–40.0)

## 2018-11-08 LAB — TSH: TSH: 2.2 u[IU]/mL (ref 0.35–4.50)

## 2018-11-08 LAB — HEMOGLOBIN A1C: Hgb A1c MFr Bld: 6.5 % (ref 4.6–6.5)

## 2018-11-08 LAB — VITAMIN B12: Vitamin B-12: 267 pg/mL (ref 211–911)

## 2018-11-08 MED ORDER — AMLODIPINE BESYLATE 10 MG PO TABS: 10.0000 mg | ORAL_TABLET | Freq: Every day | ORAL | 1 refills | Status: DC

## 2018-11-08 NOTE — Assessment & Plan Note (Signed)
B12, cbc

## 2018-11-08 NOTE — Assessment & Plan Note (Signed)
Stressed weight loss - has prediabetes, htn Stressed exercise Fu in 6 months

## 2018-11-08 NOTE — Assessment & Plan Note (Signed)
Not controlled - she also has some white coat htn Has not taken amlodipine x 1-2 weeks, taking other two Has not checked BP at home Amlodipine sent to pharmacy Stressed monitoring BP at home and the need to make sure BP is well controlled Stressed exercise and weight loss Low sodium diet Cmp, tsh

## 2018-11-08 NOTE — Assessment & Plan Note (Addendum)
Check a1c Low sugar / carb diet Stressed regular exercise Stressed weight loss   

## 2018-11-09 ENCOUNTER — Encounter: Payer: Self-pay | Admitting: Internal Medicine

## 2018-11-15 ENCOUNTER — Telehealth: Payer: Self-pay | Admitting: Internal Medicine

## 2018-11-15 ENCOUNTER — Other Ambulatory Visit: Payer: Self-pay | Admitting: Internal Medicine

## 2018-11-15 MED ORDER — OLMESARTAN MEDOXOMIL-HCTZ 40-25 MG PO TABS: 1.0000 | ORAL_TABLET | Freq: Every day | ORAL | 1 refills | Status: DC

## 2018-11-15 NOTE — Telephone Encounter (Signed)
rx refill olmesartan-hydrochlorothiazide (BENICAR HCT) 40-25 MG tablet  PHARMACY [id number] - [place], [place] [phone] (Phone) [phone] (Fax

## 2018-11-23 ENCOUNTER — Telehealth: Payer: Self-pay | Admitting: Internal Medicine

## 2018-11-23 NOTE — Telephone Encounter (Signed)
Urgent care

## 2018-11-23 NOTE — Telephone Encounter (Signed)
[id number] - [place], [place]  [phone] [place] [phone]  Phone: [phone] Fax: [phone]   Pt called to report that she has nasal congestion, yellow drainage, pressure in face, headache, and watery eyes. She is requesting to have something made available for her at listed pharmacy

## 2018-11-23 NOTE — Telephone Encounter (Signed)
She needs to be seen for this.  She could do a virtual visit tomorrow or go to urgent care.

## 2018-11-23 NOTE — Telephone Encounter (Signed)
Will you please see if she wants to be see tomorrow at sat clinic. Thank you!

## 2018-11-23 NOTE — Telephone Encounter (Signed)
Appointment scheduled with Saturday clinic.

## 2018-11-24 ENCOUNTER — Encounter: Payer: Self-pay | Admitting: Family Medicine

## 2018-11-24 ENCOUNTER — Ambulatory Visit (INDEPENDENT_AMBULATORY_CARE_PROVIDER_SITE_OTHER): Payer: 59 | Admitting: Family Medicine

## 2018-11-24 ENCOUNTER — Other Ambulatory Visit: Payer: Self-pay

## 2018-11-24 DIAGNOSIS — J019 Acute sinusitis, unspecified: Secondary | ICD-10-CM | POA: Diagnosis not present

## 2018-11-24 MED ORDER — AMOXICILLIN 500 MG PO CAPS: 1000.0000 mg | ORAL_CAPSULE | Freq: Two times a day (BID) | ORAL | 0 refills | Status: DC

## 2018-11-24 NOTE — Progress Notes (Signed)
[facility] [address] [place], [place] [id number] [phone]  Date:  11/24/2018   Name:  Rebecca Foley   DOB:  1961-01-21   MRN:  [id number]  PCP:  Binnie Rail, MD    Chief Complaint: No chief complaint on file.   History of Present Illness:  Rebecca Foley is a 58 y.o. very pleasant female patient who presents with the following:  Patient of Dr. Billey Gosling, history of hypertension and well-controlled diabetes  virtual visit today for concern of illness  Patient location is home, provider is at office Patient identity confirmed with 2 factors, she gives consent for virtual visit today  She has noticed sx of sinus congestion and yellow discharge when she blows her nose She notes pressure in her sinuses which makes her eyes water She has noted the symptoms for a week or more, getting worse  No fever or chills She does not otherwise feel sick- no flu sx, no body aches  No vomiting or diarrhea No particular ST.  Her ears feel congested   It seems like a sinus infection to her - she has had this in the past   She has taken her blood pressure medication regularly   Lab Results  Component Value Date   HGBA1C 6.5 11/08/2018     Patient Active Problem List   Diagnosis Date Noted  . Acute meniscal tear of right knee 08/13/2015  . Recurrent right knee instability 08/13/2015  . Pes planus of both feet 08/13/2015  . Obesity 07/22/2015  . Diabetes mellitus without complication ([place]) 123456  . Vitamin B12 deficiency anemia 07/21/2014  . Perennial allergic rhinitis with seasonal variation 07/08/2009  . COLONIC POLYPS 07/03/2007  . KERATOCONUS 07/03/2007  . Essential hypertension 07/03/2007    Past Medical History:  Diagnosis Date  . Allergy    perennial  . Anemia    heavy menses  . Cellulitis of right lower leg 07/17/2014   07/2014   . Hypertension   . Impaired fasting glucose 2011   108   . Nonspecific elevation of levels of transaminase or lactic acid dehydrogenase (LDH) 2011   ALT 43    Past Surgical History:  Procedure Laterality Date  . CESAREAN SECTION    . COLONOSCOPY  2009 & 2011   polyps 2009; negative 2011  . DILATION AND CURETTAGE OF UTERUS    . INCISION AND DRAINAGE ABSCESS Right 06/17/2014   Procedure: INCISION AND DRAINAGE DEBRIDEMENT OF CHRONIC RIGH BREAST ABSCESS;  Surgeon: Greer Pickerel, MD;  Location: [facility];  Service: General;  Laterality: Right;  . incision and drainage of left breast  12/2013  . thumb sugery     trigger  . TUBAL LIGATION      Social History   Tobacco Use  . Smoking status: Never Smoker  . Smokeless tobacco: Never Used  Substance Use Topics  . Alcohol use: No  . Drug use: No    Family History  Problem Relation Age of Onset  . Hypertension Mother   . Hypertension Father   . Stroke Father 43  . Heart disease Father        CM  . Alcohol abuse Brother   . Cancer Brother        ? prostate; PMH of Agent Orange exposure in [place]  . Heart attack Neg Hx     No Known Allergies  Medication list has been reviewed and  updated.  Current Outpatient Medications on File Prior to Visit  Medication Sig Dispense Refill  . amLODipine (NORVASC) 10 MG tablet Take 1 tablet (10 mg total) by mouth daily. 90 tablet 1  . diclofenac sodium (VOLTAREN) 1 % GEL Apply 4 g topically 4 (four) times daily. 4 Tube 0  . fluticasone (FLONASE) 50 MCG/ACT nasal spray SPRAY 2 SPRAYS INTO EACH NOSTRIL EVERY DAY 48 mL 2  . ibuprofen (ADVIL,MOTRIN) 200 MG tablet Take 800 mg by mouth every 6 (six) hours as needed (pain).    Marland Kitchen levocetirizine (XYZAL) 5 MG tablet TAKE 1 TABLET BY MOUTH EVERY DAY IN THE EVENING 90 tablet 1  . meloxicam (MOBIC) 15 MG tablet TAKE 1 TABLET BY MOUTH EVERY DAY 90 tablet 0  . metoprolol succinate (TOPROL-XL) 100 MG 24 hr tablet TAKE 1 TABLET (100 MG TOTAL) BY MOUTH DAILY. TAKE WITH OR IMMEDIATELY FOLLOWING A MEAL. 90 tablet 1  .  olmesartan-hydrochlorothiazide (BENICAR HCT) 40-25 MG tablet TAKE 1 TABLET BY MOUTH EVERY DAY 90 tablet 1  . valACYclovir (VALTREX) 1000 MG tablet TAKE 2 TABLETS (2,000 MG TOTAL) BY MOUTH EVERY 12 (TWELVE) HOURS AS NEEDED. TAKE FOR ONE DAY ONLY FOR OUTBREAK. 36 tablet 0  . vitamin B-12 (CYANOCOBALAMIN) 1000 MCG tablet Take 1 tablet (1,000 mcg total) by mouth daily.     No current facility-administered medications on file prior to visit.     Review of Systems:  As per HPI- otherwise negative.   Physical Examination: There were no vitals filed for this visit. There were no vitals filed for this visit. There is no height or weight on file to calculate BMI. Ideal Body Weight:    She notes recent BP was under good control She did not check her blood pressure so far today No fever noted Pt observed over video She appears well, no cough or distress noted    Assessment and Plan: Acute non-recurrent sinusitis, unspecified location - Plan: amoxicillin (AMOXIL) 500 MG capsule  Virtual visit today for acute sinusitis.  Called in a prescription for amoxicillin for her.  She is bothered by nasal congestion, I suggested that she try over-the-counter Afrin spray for a few days only.  Asked her to let us know if not feeling better in the next several days, sooner if worse  She declines Covid testing today  Signed Lamar Blinks, MD

## 2018-12-07 ENCOUNTER — Other Ambulatory Visit: Payer: Self-pay | Admitting: Internal Medicine

## 2019-03-14 ENCOUNTER — Other Ambulatory Visit: Payer: Self-pay

## 2019-03-14 MED ORDER — METOPROLOL SUCCINATE ER 100 MG PO TB24: 100.0000 mg | ORAL_TABLET | Freq: Every day | ORAL | 0 refills | Status: DC

## 2019-04-14 ENCOUNTER — Other Ambulatory Visit: Payer: Self-pay | Admitting: Internal Medicine

## 2019-04-23 ENCOUNTER — Other Ambulatory Visit: Payer: Self-pay | Admitting: Internal Medicine

## 2019-05-30 ENCOUNTER — Ambulatory Visit: Payer: 59 | Admitting: Internal Medicine

## 2019-05-31 ENCOUNTER — Other Ambulatory Visit: Payer: Self-pay | Admitting: Internal Medicine

## 2019-05-31 DIAGNOSIS — N6001 Solitary cyst of right breast: Secondary | ICD-10-CM

## 2019-06-21 DIAGNOSIS — B009 Herpesviral infection, unspecified: Secondary | ICD-10-CM | POA: Insufficient documentation

## 2019-06-21 NOTE — Patient Instructions (Addendum)
  Blood work was ordered.     No immunization administered today.   Medications reviewed and updated.  Changes include :     Your prescription(s) have been submitted to your pharmacy. Please take as directed and contact our office if you believe you are having problem(s) with the medication(s).  A referral was ordered for    Please followup in 6 months

## 2019-06-21 NOTE — Progress Notes (Signed)
Subjective:    Patient ID: Rebecca Foley, female    DOB: [birth year], 59 y.o.   MRN: [id number]  HPI The patient is here for follow up of their chronic medical problems, including hypertension, diabetes, obesity, B12 def, seasonal allergies, herpes    Medications and allergies reviewed with patient and updated if appropriate.  Patient Active Problem List   Diagnosis Date Noted  . Acute meniscal tear of right knee 08/13/2015  . Recurrent right knee instability 08/13/2015  . Pes planus of both feet 08/13/2015  . Obesity 07/22/2015  . Diabetes mellitus without complication ([place]) 123456  . Vitamin B12 deficiency anemia 07/21/2014  . Perennial allergic rhinitis with seasonal variation 07/08/2009  . COLONIC POLYPS 07/03/2007  . KERATOCONUS 07/03/2007  . Essential hypertension 07/03/2007    Current Outpatient Medications on File Prior to Visit  Medication Sig Dispense Refill  . amLODipine (NORVASC) 10 MG tablet Take 1 tablet (10 mg total) by mouth daily. 90 tablet 1  . amoxicillin (AMOXIL) 500 MG capsule Take 2 capsules (1,000 mg total) by mouth 2 (two) times daily. 40 capsule 0  . diclofenac sodium (VOLTAREN) 1 % GEL Apply 4 g topically 4 (four) times daily. 4 Tube 0  . fluticasone (FLONASE) 50 MCG/ACT nasal spray SPRAY 2 SPRAYS INTO EACH NOSTRIL EVERY DAY 48 mL 2  . ibuprofen (ADVIL,MOTRIN) 200 MG tablet Take 800 mg by mouth every 6 (six) hours as needed (pain).    Marland Kitchen levocetirizine (XYZAL) 5 MG tablet TAKE 1 TABLET BY MOUTH EVERY DAY IN THE EVENING 90 tablet 1  . meloxicam (MOBIC) 15 MG tablet TAKE 1 TABLET BY MOUTH EVERY DAY 90 tablet 0  . metoprolol succinate (TOPROL-XL) 100 MG 24 hr tablet TAKE 1 TABLET (100 MG TOTAL) BY MOUTH DAILY. TAKE WITH OR IMMEDIATELY FOLLOWING A MEAL. 90 tablet 1  . olmesartan-hydrochlorothiazide (BENICAR HCT) 40-25 MG tablet TAKE 1 TABLET BY MOUTH EVERY DAY 90 tablet 1  . valACYclovir (VALTREX) 1000 MG tablet TAKE 2 TABLETS (2,000 MG TOTAL) BY  MOUTH EVERY 12 (TWELVE) HOURS AS NEEDED. TAKE FOR ONE DAY ONLY FOR OUTBREAK. 36 tablet 0  . vitamin B-12 (CYANOCOBALAMIN) 1000 MCG tablet Take 1 tablet (1,000 mcg total) by mouth daily.     No current facility-administered medications on file prior to visit.    Past Medical History:  Diagnosis Date  . Allergy    perennial  . Anemia    heavy menses  . Cellulitis of right lower leg 07/17/2014   07/2014   . Hypertension   . Impaired fasting glucose 2011   108  . Nonspecific elevation of levels of transaminase or lactic acid dehydrogenase (LDH) 2011   ALT 43    Past Surgical History:  Procedure Laterality Date  . CESAREAN SECTION    . COLONOSCOPY  2009 & 2011   polyps 2009; negative 2011  . DILATION AND CURETTAGE OF UTERUS    . INCISION AND DRAINAGE ABSCESS Right 06/17/2014   Procedure: INCISION AND DRAINAGE DEBRIDEMENT OF CHRONIC RIGH BREAST ABSCESS;  Surgeon: Greer Pickerel, MD;  Location: [facility];  Service: General;  Laterality: Right;  . incision and drainage of left breast  12/2013  . thumb sugery     trigger  . TUBAL LIGATION      Social History   Socioeconomic History  . Marital status: Married    Spouse name: Not on file  . Number of children: Not on file  . Years of education: Not on file  .  Highest education level: Not on file  Occupational History  . Not on file  Tobacco Use  . Smoking status: Never Smoker  . Smokeless tobacco: Never Used  Substance and Sexual Activity  . Alcohol use: No  . Drug use: No  . Sexual activity: Not on file  Other Topics Concern  . Not on file  Social History Narrative  . Not on file   Social Determinants of Health   Financial Resource Strain:   . Difficulty of Paying Living Expenses:   Food Insecurity:   . Worried About Charity fundraiser in the Last Year:   . Arboriculturist in the Last Year:   Transportation Needs:   . Film/video editor (Medical):   Marland Kitchen Lack of Transportation (Non-Medical):   Physical Activity:   .  Days of Exercise per Week:   . Minutes of Exercise per Session:   Stress:   . Feeling of Stress :   Social Connections:   . Frequency of Communication with Friends and Family:   . Frequency of Social Gatherings with Friends and Family:   . Attends Religious Services:   . Active Member of Clubs or Organizations:   . Attends Archivist Meetings:   Marland Kitchen Marital Status:     Family History  Problem Relation Age of Onset  . Hypertension Mother   . Hypertension Father   . Stroke Father 106  . Heart disease Father        CM  . Alcohol abuse Brother   . Cancer Brother        ? prostate; PMH of Agent Orange exposure in [place]  . Heart attack Neg Hx     Review of Systems     Objective:  There were no vitals filed for this visit. BP Readings from Last 3 Encounters:  11/08/18 (!) 180/90  04/10/18 (!) 152/78  11/06/17 (!) 146/82   Wt Readings from Last 3 Encounters:  11/08/18 290 lb (131.5 kg)  04/10/18 295 lb (133.8 kg)  11/06/17 286 lb (129.7 kg)   There is no height or weight on file to calculate BMI.   Physical Exam    Constitutional: Appears well-developed and well-nourished. No distress.  HENT:  Head: Normocephalic and atraumatic.  Neck: Neck supple. No tracheal deviation present. No thyromegaly present.  No cervical lymphadenopathy Cardiovascular: Normal rate, regular rhythm and normal heart sounds.   No murmur heard. No carotid bruit .  No edema Pulmonary/Chest: Effort normal and breath sounds normal. No respiratory distress. No has no wheezes. No rales.  Skin: Skin is warm and dry. Not diaphoretic.  Psychiatric: Normal mood and affect. Behavior is normal.      Assessment & Plan:    See Problem List for Assessment and Plan of chronic medical problems.    This visit occurred during the SARS-CoV-2 public health emergency.  Safety protocols were in place, including screening questions prior to the visit, additional usage of staff PPE, and extensive cleaning of  exam room while observing appropriate contact time as indicated for disinfecting solutions.    This encounter was created in error - please disregard.

## 2019-06-24 ENCOUNTER — Encounter: Payer: 59 | Admitting: Internal Medicine

## 2019-07-14 NOTE — Progress Notes (Signed)
Subjective:    Patient ID: Rebecca Foley, female    DOB: 29-May-1960, 59 y.o.   MRN: [id number]  HPI The patient is here for follow up of their chronic medical problems, including hypertension, diabetes, obesity, B12 def, seasonal allergies, herpes  She has not been exercising and is not eating good.  She has gained weight.   She checks her BP occasionally, but has not checked it recently.  Medications and allergies reviewed with patient and updated if appropriate.  Patient Active Problem List   Diagnosis Date Noted  . Herpes 06/21/2019  . Acute meniscal tear of right knee 08/13/2015  . Recurrent right knee instability 08/13/2015  . Pes planus of both feet 08/13/2015  . Obesity 07/22/2015  . Diabetes mellitus without complication ([place]) 95/63/8756  . Vitamin B12 deficiency anemia 07/21/2014  . Perennial allergic rhinitis with seasonal variation 07/08/2009  . COLONIC POLYPS 07/03/2007  . KERATOCONUS 07/03/2007  . Essential hypertension 07/03/2007    Current Outpatient Medications on File Prior to Visit  Medication Sig Dispense Refill  . amLODipine (NORVASC) 10 MG tablet Take 1 tablet (10 mg total) by mouth daily. 90 tablet 1  . diclofenac sodium (VOLTAREN) 1 % GEL Apply 4 g topically 4 (four) times daily. 4 Tube 0  . fluticasone (FLONASE) 50 MCG/ACT nasal spray SPRAY 2 SPRAYS INTO EACH NOSTRIL EVERY DAY 48 mL 2  . ibuprofen (ADVIL,MOTRIN) 200 MG tablet Take 800 mg by mouth every 6 (six) hours as needed (pain).    Marland Kitchen levocetirizine (XYZAL) 5 MG tablet TAKE 1 TABLET BY MOUTH EVERY DAY IN THE EVENING 90 tablet 1  . meloxicam (MOBIC) 15 MG tablet TAKE 1 TABLET BY MOUTH EVERY DAY 90 tablet 0  . metoprolol succinate (TOPROL-XL) 100 MG 24 hr tablet TAKE 1 TABLET (100 MG TOTAL) BY MOUTH DAILY. TAKE WITH OR IMMEDIATELY FOLLOWING A MEAL. 90 tablet 1  . olmesartan-hydrochlorothiazide (BENICAR HCT) 40-25 MG tablet TAKE 1 TABLET BY MOUTH EVERY DAY 90 tablet 1  . valACYclovir (VALTREX) 1000  MG tablet TAKE 2 TABLETS (2,000 MG TOTAL) BY MOUTH EVERY 12 (TWELVE) HOURS AS NEEDED. TAKE FOR ONE DAY ONLY FOR OUTBREAK. 36 tablet 0  . vitamin B-12 (CYANOCOBALAMIN) 1000 MCG tablet Take 1 tablet (1,000 mcg total) by mouth daily.     No current facility-administered medications on file prior to visit.    Past Medical History:  Diagnosis Date  . Allergy    perennial  . Anemia    heavy menses  . Cellulitis of right lower leg 07/17/2014   07/2014   . Hypertension   . Impaired fasting glucose 2011   108  . Nonspecific elevation of levels of transaminase or lactic acid dehydrogenase (LDH) 2011   ALT 43    Past Surgical History:  Procedure Laterality Date  . CESAREAN SECTION    . COLONOSCOPY  2009 & 2011   polyps 2009; negative 2011  . DILATION AND CURETTAGE OF UTERUS    . INCISION AND DRAINAGE ABSCESS Right 06/17/2014   Procedure: INCISION AND DRAINAGE DEBRIDEMENT OF CHRONIC RIGH BREAST ABSCESS;  Surgeon: Greer Pickerel, MD;  Location: [facility];  Service: General;  Laterality: Right;  . incision and drainage of left breast  12/2013  . thumb sugery     trigger  . TUBAL LIGATION      Social History   Socioeconomic History  . Marital status: Married    Spouse name: Not on file  . Number of children: Not on  file  . Years of education: Not on file  . Highest education level: Not on file  Occupational History  . Not on file  Tobacco Use  . Smoking status: Never Smoker  . Smokeless tobacco: Never Used  Substance and Sexual Activity  . Alcohol use: No  . Drug use: No  . Sexual activity: Not on file  Other Topics Concern  . Not on file  Social History Narrative  . Not on file   Social Determinants of Health   Financial Resource Strain:   . Difficulty of Paying Living Expenses:   Food Insecurity:   . Worried About Charity fundraiser in the Last Year:   . Arboriculturist in the Last Year:   Transportation Needs:   . Film/video editor (Medical):   Marland Kitchen Lack of  Transportation (Non-Medical):   Physical Activity:   . Days of Exercise per Week:   . Minutes of Exercise per Session:   Stress:   . Feeling of Stress :   Social Connections:   . Frequency of Communication with Friends and Family:   . Frequency of Social Gatherings with Friends and Family:   . Attends Religious Services:   . Active Member of Clubs or Organizations:   . Attends Archivist Meetings:   Marland Kitchen Marital Status:     Family History  Problem Relation Age of Onset  . Hypertension Mother   . Hypertension Father   . Stroke Father 34  . Heart disease Father        CM  . Alcohol abuse Brother   . Cancer Brother        ? prostate; PMH of Agent Orange exposure in [place]  . Heart attack Neg Hx     Review of Systems  Constitutional: Negative for chills and fever.  Respiratory: Negative for cough, shortness of breath and wheezing.   Cardiovascular: Positive for leg swelling (intermittent). Negative for chest pain and palpitations.  Neurological: Negative for light-headedness and headaches.       Objective:   Vitals:   07/15/19 0836  BP: (!) 166/84  Pulse: 76  Temp: 98.4 F (36.9 C)  SpO2: 99%   BP Readings from Last 3 Encounters:  07/15/19 (!) 166/84  11/08/18 (!) 180/90  04/10/18 (!) 152/78   Wt Readings from Last 3 Encounters:  07/15/19 298 lb 12.8 oz (135.5 kg)  11/08/18 290 lb (131.5 kg)  04/10/18 295 lb (133.8 kg)   Body mass index is 49.72 kg/m.   Physical Exam    Constitutional: Appears well-developed and well-nourished. No distress.  HENT:  Head: Normocephalic and atraumatic.  Neck: Neck supple. No tracheal deviation present. No thyromegaly present.  No cervical lymphadenopathy Cardiovascular: Normal rate, regular rhythm and normal heart sounds.   No murmur heard. No carotid bruit .  Mild b/l LE edema Pulmonary/Chest: Effort normal and breath sounds normal. No respiratory distress. No has no wheezes. No rales.  Skin: Skin is warm and dry. Not  diaphoretic.  Psychiatric: Normal mood and affect. Behavior is normal.      Assessment & Plan:    See Problem List for Assessment and Plan of chronic medical problems.    This visit occurred during the SARS-CoV-2 public health emergency.  Safety protocols were in place, including screening questions prior to the visit, additional usage of staff PPE, and extensive cleaning of exam room while observing appropriate contact time as indicated for disinfecting solutions.

## 2019-07-14 NOTE — Patient Instructions (Addendum)
  Blood work was ordered.     A tetanus vaccine was given today.  Medications reviewed and updated.  Changes include :   Add bystolic 5mg  daily  Your prescription(s) have been submitted to your pharmacy. Please take as directed and contact our office if you believe you are having problem(s) with the medication(s).  A referral was ordered for nutrition.     Someone will call you to schedule this.    Please followup in 6 months

## 2019-07-15 ENCOUNTER — Encounter: Payer: Self-pay | Admitting: Internal Medicine

## 2019-07-15 ENCOUNTER — Ambulatory Visit: Payer: 59 | Admitting: Internal Medicine

## 2019-07-15 ENCOUNTER — Other Ambulatory Visit: Payer: Self-pay

## 2019-07-15 VITALS — BP 166/84 | HR 76 | Temp 98.4°F | Ht 65.0 in | Wt 298.8 lb

## 2019-07-15 DIAGNOSIS — D519 Vitamin B12 deficiency anemia, unspecified: Secondary | ICD-10-CM

## 2019-07-15 DIAGNOSIS — E119 Type 2 diabetes mellitus without complications: Secondary | ICD-10-CM

## 2019-07-15 DIAGNOSIS — I10 Essential (primary) hypertension: Secondary | ICD-10-CM

## 2019-07-15 DIAGNOSIS — Z23 Encounter for immunization: Secondary | ICD-10-CM

## 2019-07-15 DIAGNOSIS — J302 Other seasonal allergic rhinitis: Secondary | ICD-10-CM

## 2019-07-15 DIAGNOSIS — J3089 Other allergic rhinitis: Secondary | ICD-10-CM

## 2019-07-15 DIAGNOSIS — B009 Herpesviral infection, unspecified: Secondary | ICD-10-CM

## 2019-07-15 LAB — COMPREHENSIVE METABOLIC PANEL
ALT: 35 U/L (ref 0–35)
AST: 25 U/L (ref 0–37)
Albumin: 4.2 g/dL (ref 3.5–5.2)
Alkaline Phosphatase: 62 U/L (ref 39–117)
BUN: 11 mg/dL (ref 6–23)
CO2: 26 mEq/L (ref 19–32)
Calcium: 10.4 mg/dL (ref 8.4–10.5)
Chloride: 105 mEq/L (ref 96–112)
Creatinine, Ser: 0.76 mg/dL (ref 0.40–1.20)
GFR: 93.57 mL/min (ref >60.00)
Glucose, Bld: 100 mg/dL — ABNORMAL HIGH (ref 70–99)
Potassium: 4.1 mEq/L (ref 3.5–5.1)
Sodium: 140 mEq/L (ref 135–145)
Total Bilirubin: 0.4 mg/dL (ref 0.2–1.2)
Total Protein: 7.1 g/dL (ref 6.0–8.3)

## 2019-07-15 LAB — VITAMIN B12: Vitamin B-12: 521 pg/mL (ref 211–911)

## 2019-07-15 LAB — HEMOGLOBIN A1C: Hgb A1c MFr Bld: 6.2 % (ref 4.6–6.5)

## 2019-07-15 MED ORDER — NEBIVOLOL HCL 5 MG PO TABS
5.0000 mg | ORAL_TABLET | Freq: Every day | ORAL | 1 refills | Status: DC
Start: 2019-07-15 — End: 2019-07-17

## 2019-07-15 NOTE — Addendum Note (Signed)
Addended by: Marcina Millard on: 07/15/2019 03:32 PM   Modules accepted: Orders

## 2019-07-15 NOTE — Assessment & Plan Note (Signed)
Chronic Taking B12 daily Check level 

## 2019-07-15 NOTE — Assessment & Plan Note (Signed)
Chronic Well controlled Continue xyzal, flonase

## 2019-07-15 NOTE — Assessment & Plan Note (Signed)
Chronic intermittent Taking valtrex prn

## 2019-07-15 NOTE — Assessment & Plan Note (Signed)
Discussed importance of weight loss Stressed regular exercise - goal 30 minutes 5 days a week Discussed decreasing portions, decreasing sugars/carbs Increase veges, lean protin nutrition referral F/u in 6 months

## 2019-07-15 NOTE — Assessment & Plan Note (Addendum)
Chronic BP not controlled Start bystolic 5 mg daily Continue other medications at current doses cmp

## 2019-07-15 NOTE — Assessment & Plan Note (Signed)
Chronic Diet controlled Check a1c Low sugar / carb diet Stressed regular exercise Weight loss

## 2019-07-17 ENCOUNTER — Telehealth: Payer: Self-pay

## 2019-07-17 MED ORDER — HYDRALAZINE HCL 25 MG PO TABS: 25.0000 mg | ORAL_TABLET | Freq: Two times a day (BID) | ORAL | 5 refills | Status: DC

## 2019-07-17 NOTE — Telephone Encounter (Signed)
New message   Seen on 6.14.21  Pt c/o medication issue:  1. Name of Medication: nebivolol (BYSTOLIC) 5 MG tablet  2. How are you currently taking this medication (dosage and times per day)? daily  3. Are you having a reaction (difficulty breathing--STAT)? No   4. What is your medication issue? The cost is  $ 221.00 looking for another options

## 2019-07-17 NOTE — Telephone Encounter (Signed)
Spoke with patient and info given 

## 2019-07-17 NOTE — Telephone Encounter (Signed)
Hydralazine 25 mg bid

## 2019-08-25 ENCOUNTER — Other Ambulatory Visit: Payer: Self-pay | Admitting: Internal Medicine

## 2019-10-15 ENCOUNTER — Other Ambulatory Visit: Payer: Self-pay | Admitting: Internal Medicine

## 2019-12-17 ENCOUNTER — Other Ambulatory Visit: Payer: Self-pay | Admitting: Internal Medicine

## 2020-01-01 ENCOUNTER — Other Ambulatory Visit: Payer: Self-pay | Admitting: Internal Medicine

## 2020-04-03 ENCOUNTER — Other Ambulatory Visit: Payer: Self-pay | Admitting: Internal Medicine

## 2020-06-13 ENCOUNTER — Other Ambulatory Visit: Payer: Self-pay | Admitting: Internal Medicine

## 2020-09-14 ENCOUNTER — Other Ambulatory Visit: Payer: Self-pay | Admitting: Internal Medicine

## 2020-12-10 ENCOUNTER — Other Ambulatory Visit: Payer: Self-pay | Admitting: Internal Medicine

## 2021-03-07 ENCOUNTER — Other Ambulatory Visit: Payer: Self-pay | Admitting: Internal Medicine

## 2021-03-12 ENCOUNTER — Other Ambulatory Visit: Payer: Self-pay | Admitting: Internal Medicine

## 2021-03-15 ENCOUNTER — Telehealth: Payer: Self-pay | Admitting: Internal Medicine

## 2021-03-15 ENCOUNTER — Other Ambulatory Visit: Payer: Self-pay

## 2021-03-15 MED ORDER — AMLODIPINE BESYLATE 10 MG PO TABS: 10.0000 mg | ORAL_TABLET | Freq: Every day | ORAL | 0 refills | Status: DC

## 2021-03-15 MED ORDER — METOPROLOL SUCCINATE ER 100 MG PO TB24: ORAL_TABLET | ORAL | 0 refills | Status: DC

## 2021-03-15 MED ORDER — OLMESARTAN MEDOXOMIL-HCTZ 40-25 MG PO TABS: 1.0000 | ORAL_TABLET | Freq: Every day | ORAL | 0 refills | Status: DC

## 2021-03-15 NOTE — Telephone Encounter (Signed)
Pt requesting a short supply of amLODipine (NORVASC) 10 MG tablet  metoprolol succinate (TOPROL-XL) 100 MG 24 hr tablet  olmesartan-hydrochlorothiazide (BENICAR HCT) 40-25 MG tablet   Until next ov on 03-23-2021, Pt was last seen 07-15-2019

## 2021-03-15 NOTE — Telephone Encounter (Signed)
Week supply sent to [facility]

## 2021-03-21 ENCOUNTER — Encounter: Payer: Self-pay | Admitting: Internal Medicine

## 2021-03-21 NOTE — Patient Instructions (Addendum)
° ° °  Call [facility] to schedule your colonoscopy  Phone: [phone]     Blood work was ordered.     Medications changes include :   increase metoprolol xl to 2 tabs daily. Mounjaro injection once a week   Your prescription(s) have been sent to your pharmacy.     Return in about 6 months (around 09/20/2021) for CPE.

## 2021-03-21 NOTE — Progress Notes (Signed)
Subjective:    Patient ID: Rebecca Foley, female    DOB: October 26, 1960, 61 y.o.   MRN: [id number]  This visit occurred during the SARS-CoV-2 public health emergency.  Safety protocols were in place, including screening questions prior to the visit, additional usage of staff PPE, and extensive cleaning of exam room while observing appropriate contact time as indicated for disinfecting solutions.     HPI The patient is here for follow up of their chronic medical problems, including htn, DM, obesity, B12 def, seasonal allergies, herpes  She is not exercising regularly.   She is doing pretty good with watching her sugars intake.    She takes the olmesartan-hctz QOD due to the increase in urination.  She is not taking the hydralazine.  She has checked her BP on occasion and it was in the 160's/?.     Medications and allergies reviewed with patient and updated if appropriate.  Patient Active Problem List   Diagnosis Date Noted   Herpes 06/21/2019   Acute meniscal tear of right knee 08/13/2015   Recurrent right knee instability 08/13/2015   Pes planus of both feet 08/13/2015   Morbid obesity ([place]) 07/22/2015   Diabetes mellitus without complication ([place]) 41/28/7867   Vitamin B12 deficiency anemia 07/21/2014   Perennial allergic rhinitis with seasonal variation 07/08/2009   COLONIC POLYPS 07/03/2007   KERATOCONUS 07/03/2007   Essential hypertension 07/03/2007    Current Outpatient Medications on File Prior to Visit  Medication Sig Dispense Refill   amLODipine (NORVASC) 10 MG tablet Take 1 tablet (10 mg total) by mouth daily. 7 tablet 0   diclofenac sodium (VOLTAREN) 1 % GEL Apply 4 g topically 4 (four) times daily. 4 Tube 0   fluticasone (FLONASE) 50 MCG/ACT nasal spray SPRAY 2 SPRAYS INTO EACH NOSTRIL EVERY DAY 48 mL 2   hydrALAZINE (APRESOLINE) 25 MG tablet Take 1 tablet (25 mg total) by mouth in the morning and at bedtime. 60 tablet 5   ibuprofen (ADVIL,MOTRIN) 200 MG tablet  Take 800 mg by mouth every 6 (six) hours as needed (pain).     levocetirizine (XYZAL) 5 MG tablet TAKE 1 TABLET BY MOUTH EVERY DAY IN THE EVENING 90 tablet 1   metoprolol succinate (TOPROL-XL) 100 MG 24 hr tablet TAKE 1 TABLET BY MOUTH EVERY DAY WITH OR IMMEDIATELY FOLLOWING A MEAL 7 tablet 0   olmesartan-hydrochlorothiazide (BENICAR HCT) 40-25 MG tablet Take 1 tablet by mouth daily. 7 tablet 0   valACYclovir (VALTREX) 1000 MG tablet TAKE 2 TABLETS (2,000 MG TOTAL) BY MOUTH EVERY 12 (TWELVE) HOURS AS NEEDED. TAKE FOR ONE DAY ONLY FOR OUTBREAK. 36 tablet 0   vitamin B-12 (CYANOCOBALAMIN) 1000 MCG tablet Take 1 tablet (1,000 mcg total) by mouth daily.     No current facility-administered medications on file prior to visit.    Past Medical History:  Diagnosis Date   Allergy    perennial   Anemia    heavy menses   Cellulitis of right lower leg 07/17/2014   07/2014    Hypertension    Impaired fasting glucose 2011   108   Nonspecific elevation of levels of transaminase or lactic acid dehydrogenase (LDH) 2011   ALT 43    Past Surgical History:  Procedure Laterality Date   CESAREAN SECTION     COLONOSCOPY  2009 & 2011   polyps 2009; negative 2011   DILATION AND CURETTAGE OF UTERUS     INCISION AND DRAINAGE ABSCESS Right 06/17/2014  Procedure: INCISION AND DRAINAGE DEBRIDEMENT OF CHRONIC RIGH BREAST ABSCESS;  Surgeon: Greer Pickerel, MD;  Location: [facility];  Service: General;  Laterality: Right;   incision and drainage of left breast  12/2013   thumb sugery     trigger   TUBAL LIGATION      Social History   Socioeconomic History   Marital status: Married    Spouse name: Not on file   Number of children: Not on file   Years of education: Not on file   Highest education level: Not on file  Occupational History   Not on file  Tobacco Use   Smoking status: Never   Smokeless tobacco: Never  Substance and Sexual Activity   Alcohol use: No   Drug use: No   Sexual activity: Not on file   Other Topics Concern   Not on file  Social History Narrative   Not on file   Social Determinants of Health   Financial Resource Strain: Not on file  Food Insecurity: Not on file  Transportation Needs: Not on file  Physical Activity: Not on file  Stress: Not on file  Social Connections: Not on file    Family History  Problem Relation Age of Onset   Hypertension Mother    Hypertension Father    Stroke Father 22   Heart disease Father        CM   Alcohol abuse Brother    Cancer Brother        ? prostate; PMH of Agent Orange exposure in [place]   Heart attack Neg Hx     Review of Systems  Constitutional:  Negative for chills and fever.  Respiratory:  Negative for cough, shortness of breath and wheezing.   Cardiovascular:  Positive for leg swelling (at night). Negative for chest pain and palpitations.  Neurological:  Negative for dizziness, light-headedness and headaches (occ).      Objective:   Vitals:   03/23/21 0749  BP: (!) 164/98  Pulse: 96  Temp: 98.5 F (36.9 C)  SpO2: 99%   BP Readings from Last 3 Encounters:  03/23/21 (!) 164/98  07/15/19 (!) 166/84  11/08/18 (!) 180/90   Wt Readings from Last 3 Encounters:  03/23/21 (!) 315 lb 6.4 oz (143.1 kg)  07/15/19 298 lb 12.8 oz (135.5 kg)  11/08/18 290 lb (131.5 kg)   Body mass index is 52.49 kg/m.   Physical Exam    Constitutional: Appears well-developed and well-nourished. No distress.  HENT:  Head: Normocephalic and atraumatic.  Neck: Neck supple. No tracheal deviation present. No thyromegaly present.  No cervical lymphadenopathy Cardiovascular: Normal rate, regular rhythm and normal heart sounds.   No murmur heard. No carotid bruit .  Bilateral 1+ lower extremity edema Pulmonary/Chest: Effort normal and breath sounds normal. No respiratory distress. No has no wheezes. No rales.  Skin: Skin is warm and dry. Not diaphoretic.  Psychiatric: Normal mood and affect. Behavior is normal.      Assessment &  Plan:    See Problem List for Assessment and Plan of chronic medical problems.

## 2021-03-23 ENCOUNTER — Other Ambulatory Visit: Payer: Self-pay

## 2021-03-23 ENCOUNTER — Ambulatory Visit: Payer: 59 | Admitting: Internal Medicine

## 2021-03-23 VITALS — BP 164/98 | HR 96 | Temp 98.5°F | Ht 65.0 in | Wt 315.4 lb

## 2021-03-23 DIAGNOSIS — D519 Vitamin B12 deficiency anemia, unspecified: Secondary | ICD-10-CM

## 2021-03-23 DIAGNOSIS — E119 Type 2 diabetes mellitus without complications: Secondary | ICD-10-CM | POA: Diagnosis not present

## 2021-03-23 DIAGNOSIS — B009 Herpesviral infection, unspecified: Secondary | ICD-10-CM

## 2021-03-23 DIAGNOSIS — I10 Essential (primary) hypertension: Secondary | ICD-10-CM

## 2021-03-23 DIAGNOSIS — J3089 Other allergic rhinitis: Secondary | ICD-10-CM

## 2021-03-23 DIAGNOSIS — J302 Other seasonal allergic rhinitis: Secondary | ICD-10-CM

## 2021-03-23 LAB — CBC WITH DIFFERENTIAL/PLATELET
Basophils Absolute: 0 10*3/uL (ref 0.0–0.1)
Basophils Relative: 0.7 % (ref 0.0–3.0)
Eosinophils Absolute: 0.1 10*3/uL (ref 0.0–0.7)
Eosinophils Relative: 1.7 % (ref 0.0–5.0)
HCT: 41 % (ref 36.0–46.0)
Hemoglobin: 13.4 g/dL (ref 12.0–15.0)
Lymphocytes Relative: 24.6 % (ref 12.0–46.0)
Lymphs Abs: 1.5 10*3/uL (ref 0.7–4.0)
MCHC: 32.8 g/dL (ref 30.0–36.0)
MCV: 86.2 fl (ref 78.0–100.0)
Monocytes Absolute: 0.7 10*3/uL (ref 0.1–1.0)
Monocytes Relative: 11.5 % (ref 3.0–12.0)
Neutro Abs: 3.7 10*3/uL (ref 1.4–7.7)
Neutrophils Relative %: 61.5 % (ref 43.0–77.0)
Platelets: 269 10*3/uL (ref 150.0–400.0)
RBC: 4.76 Mil/uL (ref 3.87–5.11)
RDW: 13.8 % (ref 11.5–15.5)
WBC: 6 10*3/uL (ref 4.0–10.5)

## 2021-03-23 LAB — COMPREHENSIVE METABOLIC PANEL
ALT: 70 U/L — ABNORMAL HIGH (ref 0–35)
AST: 52 U/L — ABNORMAL HIGH (ref 0–37)
Albumin: 4.3 g/dL (ref 3.5–5.2)
Alkaline Phosphatase: 54 U/L (ref 39–117)
BUN: 14 mg/dL (ref 6–23)
CO2: 30 mEq/L (ref 19–32)
Calcium: 10.7 mg/dL — ABNORMAL HIGH (ref 8.4–10.5)
Chloride: 102 mEq/L (ref 96–112)
Creatinine, Ser: 0.87 mg/dL (ref 0.40–1.20)
GFR: 71.2 mL/min (ref >60.00)
Glucose, Bld: 107 mg/dL — ABNORMAL HIGH (ref 70–99)
Potassium: 4.2 mEq/L (ref 3.5–5.1)
Sodium: 138 mEq/L (ref 135–145)
Total Bilirubin: 0.5 mg/dL (ref 0.2–1.2)
Total Protein: 7.7 g/dL (ref 6.0–8.3)

## 2021-03-23 LAB — LIPID PANEL
Cholesterol: 159 mg/dL (ref 0–200)
HDL: 49.8 mg/dL (ref >39.00)
LDL Cholesterol: 90 mg/dL (ref 0–99)
NonHDL: 109.29
Total CHOL/HDL Ratio: 3
Triglycerides: 97 mg/dL (ref 0.0–149.0)
VLDL: 19.4 mg/dL (ref 0.0–40.0)

## 2021-03-23 LAB — MICROALBUMIN / CREATININE URINE RATIO
Creatinine,U: 42.9 mg/dL
Microalb Creat Ratio: 12.5 mg/g (ref 0.0–30.0)
Microalb, Ur: 5.3 mg/dL — ABNORMAL HIGH (ref 0.0–1.9)

## 2021-03-23 LAB — HEMOGLOBIN A1C: Hgb A1c MFr Bld: 6.2 % (ref 4.6–6.5)

## 2021-03-23 LAB — VITAMIN B12: Vitamin B-12: 1009 pg/mL — ABNORMAL HIGH (ref 211–911)

## 2021-03-23 MED ORDER — HYDROCHLOROTHIAZIDE 25 MG PO TABS: 25.0000 mg | ORAL_TABLET | Freq: Every day | ORAL | 3 refills | Status: DC

## 2021-03-23 MED ORDER — AMLODIPINE-OLMESARTAN 10-40 MG PO TABS: 1.0000 | ORAL_TABLET | Freq: Every day | ORAL | 3 refills | Status: DC

## 2021-03-23 MED ORDER — TIRZEPATIDE 2.5 MG/0.5ML ~~LOC~~ SOAJ: 2.5000 mg | SUBCUTANEOUS | 0 refills | Status: DC

## 2021-03-23 MED ORDER — METOPROLOL SUCCINATE ER 100 MG PO TB24: 200.0000 mg | ORAL_TABLET | Freq: Every day | ORAL | 2 refills | Status: DC

## 2021-03-23 NOTE — Assessment & Plan Note (Signed)
Chronic Infrequent flares Valtrex 2000 mg every 12 hours x1 day as needed

## 2021-03-23 NOTE — Assessment & Plan Note (Addendum)
Chronic Stressed importance of weight loss-we will increase her energy, improve her blood pressure hopefully, keep her sugars well controlled, improve joint issues Stressed regular exercise Discussed healthy diet, decrease portions We will start Mounjaro for diabetes and to help with weight loss

## 2021-03-23 NOTE — Assessment & Plan Note (Signed)
Chronic Check B12 level, CBC 

## 2021-03-23 NOTE — Assessment & Plan Note (Addendum)
Chronic Controlled, Stable Continue Xyzal, Flonase prn

## 2021-03-23 NOTE — Assessment & Plan Note (Addendum)
Chronic Diet controlled Check A1c, urine microalbumin Stressed diabetic diet, exercise and ideally weight loss Will start mounjaro 2.5 mg weekly - discussed side effects - she would greatly benefit from weight loss

## 2021-03-23 NOTE — Assessment & Plan Note (Addendum)
Chronic Blood pressure well controlled CMP Not taking hydralazine and taking benicar-hct QOD due to excessive urination Discontinue hydralazine - she is not taking Increase metoprolol xl to 200 mg daily Continue amlodipine 10 mg daily - combine with olmesartan 40 mg daily, continue hctz 25 mg daily as separate dose-  Will soon be at a different location and bathroom will be closer and she will be able to take more consistently  stressed importance of good BP control

## 2021-04-08 ENCOUNTER — Telehealth: Payer: Self-pay

## 2021-04-08 NOTE — Telephone Encounter (Signed)
Pt states that she has taken 3 injection and now is experiencing hazy vision.  ? ?Pt states she had been doing the injections in her belly but this 3 rd injection was done in the back of her arm and she wasn't sure if that had anything to do with it. ? ?Please call pt back [phone] ? ?BP 164/82 today ?

## 2021-04-08 NOTE — Telephone Encounter (Signed)
Spoke with patient today. She is feeling better. She will call me back in the morning if she feels like she needs to be seen. ?

## 2021-04-09 NOTE — Telephone Encounter (Signed)
If patient returns call to office and needs to be seen please schedule OV. ?

## 2021-04-13 ENCOUNTER — Telehealth: Payer: Self-pay | Admitting: Internal Medicine

## 2021-04-13 NOTE — Telephone Encounter (Signed)
Pt states she takes her last injection of tirzepatide (MOUNJARO) 2.5 MG/0.5ML Pen tomorrow ? ?Pt was advised by provider to call once medication was completed ? ?Pt states she is constipated due to medication and requesting otc recommendations for constipation ? ?Pharmacy [place], [place] ?

## 2021-04-14 MED ORDER — TIRZEPATIDE 5 MG/0.5ML ~~LOC~~ SOAJ: 5.0000 mg | SUBCUTANEOUS | 0 refills | Status: DC

## 2021-04-14 NOTE — Telephone Encounter (Signed)
Spoke with patient today. 

## 2021-04-14 NOTE — Telephone Encounter (Signed)
5 mg dose sent ?

## 2021-04-19 ENCOUNTER — Telehealth: Payer: Self-pay | Admitting: Internal Medicine

## 2021-04-19 ENCOUNTER — Other Ambulatory Visit: Payer: Self-pay

## 2021-04-19 MED ORDER — LEVOCETIRIZINE DIHYDROCHLORIDE 5 MG PO TABS: ORAL_TABLET | ORAL | 1 refills | Status: DC

## 2021-04-19 NOTE — Telephone Encounter (Signed)
Sent in today 

## 2021-04-19 NOTE — Telephone Encounter (Signed)
1.Medication Requested: levocetirizine (XYZAL) 5 MG tablet ? ?2. Pharmacy (Name, Street, [place]): [facility] [phone] - [place], [place]  ?Phone:  [phone] ?Fax:  [phone] ? ? ?3. On Med List: yes ? ?4. Last Visit with PCP: 02.21.23 ? ?5. Next visit date with PCP: 05.25.23 ? ? ?Agent: Please be advised that RX refills may take up to 3 business days. We ask that you follow-up with your pharmacy.  ?

## 2021-04-21 ENCOUNTER — Other Ambulatory Visit: Payer: Self-pay

## 2021-04-21 MED ORDER — LEVOCETIRIZINE DIHYDROCHLORIDE 5 MG PO TABS: ORAL_TABLET | ORAL | 1 refills | Status: DC

## 2021-04-21 NOTE — Telephone Encounter (Signed)
Resent today.

## 2021-04-21 NOTE — Telephone Encounter (Signed)
Patient calling in ? ?Patient says she spoke w/ pharmacy & they advised her they never received rx that was sent electronically 03/20.Marland Kitchen please resubmit  ? ? ?

## 2021-05-07 ENCOUNTER — Other Ambulatory Visit: Payer: Self-pay | Admitting: Internal Medicine

## 2021-06-03 ENCOUNTER — Other Ambulatory Visit: Payer: Self-pay | Admitting: Internal Medicine

## 2021-06-23 ENCOUNTER — Encounter: Payer: Self-pay | Admitting: Internal Medicine

## 2021-06-23 NOTE — Patient Instructions (Addendum)
Blood work was ordered.     Medications changes include :   mounjaro 7.5 mg weekly   Your prescription(s) have been sent to your pharmacy.     Return in about 6 months (around 12/25/2021) for follow up.   Health Maintenance, Female Adopting a healthy lifestyle and getting preventive care are important in promoting health and wellness. Ask your health care provider about: The right schedule for you to have regular tests and exams. Things you can do on your own to prevent diseases and keep yourself healthy. What should I know about diet, weight, and exercise? Eat a healthy diet  Eat a diet that includes plenty of vegetables, fruits, low-fat dairy products, and lean protein. Do not eat a lot of foods that are high in solid fats, added sugars, or sodium. Maintain a healthy weight Body mass index (BMI) is used to identify weight problems. It estimates body fat based on height and weight. Your health care provider can help determine your BMI and help you achieve or maintain a healthy weight. Get regular exercise Get regular exercise. This is one of the most important things you can do for your health. Most adults should: Exercise for at least 150 minutes each week. The exercise should increase your heart rate and make you sweat (moderate-intensity exercise). Do strengthening exercises at least twice a week. This is in addition to the moderate-intensity exercise. Spend less time sitting. Even light physical activity can be beneficial. Watch cholesterol and blood lipids Have your blood tested for lipids and cholesterol at 63 years of age, then have this test every 5 years. Have your cholesterol levels checked more often if: Your lipid or cholesterol levels are high. You are older than 63 years of age. You are at high risk for heart disease. What should I know about cancer screening? Depending on your health history and family history, you may need to have cancer screening at  various ages. This may include screening for: Breast cancer. Cervical cancer. Colorectal cancer. Skin cancer. Lung cancer. What should I know about heart disease, diabetes, and high blood pressure? Blood pressure and heart disease High blood pressure causes heart disease and increases the risk of stroke. This is more likely to develop in people who have high blood pressure readings or are overweight. Have your blood pressure checked: Every 3-5 years if you are 7-59 years of age. Every year if you are 22 years old or older. Diabetes Have regular diabetes screenings. This checks your fasting blood sugar level. Have the screening done: Once every three years after age 36 if you are at a normal weight and have a low risk for diabetes. More often and at a younger age if you are overweight or have a high risk for diabetes. What should I know about preventing infection? Hepatitis B If you have a higher risk for hepatitis B, you should be screened for this virus. Talk with your health care provider to find out if you are at risk for hepatitis B infection. Hepatitis C Testing is recommended for: Everyone born from [birth year] through 1965. Anyone with known risk factors for hepatitis C. Sexually transmitted infections (STIs) Get screened for STIs, including gonorrhea and chlamydia, if: You are sexually active and are younger than 63 years of age. You are older than 63 years of age and your health care provider tells you that you are at risk for this type of infection. Your sexual activity has changed since you were last screened,  and you are at increased risk for chlamydia or gonorrhea. Ask your health care provider if you are at risk. Ask your health care provider about whether you are at high risk for HIV. Your health care provider may recommend a prescription medicine to help prevent HIV infection. If you choose to take medicine to prevent HIV, you should first get tested for HIV. You should then be  tested every 3 months for as long as you are taking the medicine. Pregnancy If you are about to stop having your period (premenopausal) and you may become pregnant, seek counseling before you get pregnant. Take 400 to 800 micrograms (mcg) of folic acid every day if you become pregnant. Ask for birth control (contraception) if you want to prevent pregnancy. Osteoporosis and menopause Osteoporosis is a disease in which the bones lose minerals and strength with aging. This can result in bone fractures. If you are 84 years old or older, or if you are at risk for osteoporosis and fractures, ask your health care provider if you should: Be screened for bone loss. Take a calcium or vitamin D supplement to lower your risk of fractures. Be given hormone replacement therapy (HRT) to treat symptoms of menopause. Follow these instructions at home: Alcohol use Do not drink alcohol if: Your health care provider tells you not to drink. You are pregnant, may be pregnant, or are planning to become pregnant. If you drink alcohol: Limit how much you have to: 0-1 drink a day. Know how much alcohol is in your drink. In the [place] one drink equals one 12 oz bottle of beer (355 mL), one 5 oz glass of wine (148 mL), or one 1 oz glass of hard liquor (44 mL). Lifestyle Do not use any products that contain nicotine or tobacco. These products include cigarettes, chewing tobacco, and vaping devices, such as e-cigarettes. If you need help quitting, ask your health care provider. Do not use street drugs. Do not share needles. Ask your health care provider for help if you need support or information about quitting drugs. General instructions Schedule regular health, dental, and eye exams. Stay current with your vaccines. Tell your health care provider if: You often feel depressed. You have ever been abused or do not feel safe at home. Summary Adopting a healthy lifestyle and getting preventive care are important in  promoting health and wellness. Follow your health care provider's instructions about healthy diet, exercising, and getting tested or screened for diseases. Follow your health care provider's instructions on monitoring your cholesterol and blood pressure. This information is not intended to replace advice given to you by your health care provider. Make sure you discuss any questions you have with your health care provider. Document Revised: 06/08/2020 Document Reviewed: 06/08/2020 Elsevier Patient Education  [employer].

## 2021-06-23 NOTE — Progress Notes (Unsigned)
Subjective:    Patient ID: Rebecca Foley, female    DOB: 05-28-1960, 61 y.o.   MRN: [id number]      HPI Rebecca Foley is here for  Chief Complaint  Patient presents with   Annual Exam       Medications and allergies reviewed with patient and updated if appropriate.  Current Outpatient Medications on File Prior to Visit  Medication Sig Dispense Refill   amLODipine-olmesartan (AZOR) 10-40 MG tablet Take 1 tablet by mouth daily. 90 tablet 3   diclofenac sodium (VOLTAREN) 1 % GEL Apply 4 g topically 4 (four) times daily. 4 Tube 0   fluticasone (FLONASE) 50 MCG/ACT nasal spray SPRAY 2 SPRAYS INTO EACH NOSTRIL EVERY DAY 48 mL 2   hydrochlorothiazide (HYDRODIURIL) 25 MG tablet Take 1 tablet (25 mg total) by mouth daily. 90 tablet 3   ibuprofen (ADVIL,MOTRIN) 200 MG tablet Take 800 mg by mouth every 6 (six) hours as needed (pain).     levocetirizine (XYZAL) 5 MG tablet TAKE 1 TABLET BY MOUTH EVERY DAY IN THE EVENING 90 tablet 1   metoprolol succinate (TOPROL-XL) 100 MG 24 hr tablet Take 2 tablets (200 mg total) by mouth daily. TAKE WITH OR IMMEDIATELY FOLLOWING A MEAL 180 tablet 2   vitamin B-12 (CYANOCOBALAMIN) 1000 MCG tablet Take 1 tablet (1,000 mcg total) by mouth daily.     No current facility-administered medications on file prior to visit.    Review of Systems  Constitutional:  Negative for fever.  Eyes:  Negative for visual disturbance.  Respiratory:  Negative for cough, shortness of breath and wheezing.   Cardiovascular:  Negative for chest pain, palpitations and leg swelling.  Gastrointestinal:  Negative for abdominal pain, blood in stool, constipation, diarrhea and nausea.       No gerd  Genitourinary:  Negative for dysuria.  Musculoskeletal:  Positive for arthralgias (knee). Negative for back pain.  Skin:  Negative for rash.  Neurological:  Negative for light-headedness and headaches.  Psychiatric/Behavioral:  Negative for dysphoric mood. The patient is not  nervous/anxious.       Objective:   Vitals:   06/24/21 0809 06/24/21 0836  BP: (!) 148/88 140/78  Pulse: 83   Temp: 98.6 F (37 C)   SpO2: 97%    Filed Weights   06/24/21 0809  Weight: 281 lb (127.5 kg)   Body mass index is 46.76 kg/m.  BP Readings from Last 3 Encounters:  06/24/21 140/78  03/23/21 (!) 164/98  07/15/19 (!) 166/84    Wt Readings from Last 3 Encounters:  06/24/21 281 lb (127.5 kg)  03/23/21 (!) 315 lb 6.4 oz (143.1 kg)  07/15/19 298 lb 12.8 oz (135.5 kg)       07/15/2019    3:32 PM 04/10/2018    7:59 AM 06/20/2017    4:03 PM 07/22/2015    8:34 AM  Depression screen PHQ 2/9  Decreased Interest 0 0 0 0  Down, Depressed, Hopeless 0 0 0 0  PHQ - 2 Score 0 0 0 0         View : No data to display.              Physical Exam Constitutional: She appears well-developed and well-nourished. No distress.  HENT:  Head: Normocephalic and atraumatic.  Right Ear: External ear normal. Normal ear canal and TM Left Ear: External ear normal.  Normal ear canal and TM Mouth/Throat: Oropharynx is clear and moist.  Eyes: Conjunctivae normal.  Neck:  Neck supple. No tracheal deviation present. No thyromegaly present.  No carotid bruit  Cardiovascular: Normal rate, regular rhythm and normal heart sounds.   No murmur heard.  Mild b/l LE edema. Pulmonary/Chest: Effort normal and breath sounds normal. No respiratory distress. She has no wheezes. She has no rales.  Breast: deferred   Abdominal: Soft. She exhibits no distension. There is no tenderness.  Lymphadenopathy: She has no cervical adenopathy.  Skin: Skin is warm and dry. She is not diaphoretic.  Psychiatric: She has a normal mood and affect. Her behavior is normal.     Lab Results  Component Value Date   WBC 6.0 03/23/2021   HGB 13.4 03/23/2021   HCT 41.0 03/23/2021   PLT 269.0 03/23/2021   GLUCOSE 107 (H) 03/23/2021   CHOL 159 03/23/2021   TRIG 97.0 03/23/2021   HDL 49.80 03/23/2021   LDLCALC  90 03/23/2021   ALT 70 (H) 03/23/2021   AST 52 (H) 03/23/2021   NA 138 03/23/2021   K 4.2 03/23/2021   CL 102 03/23/2021   CREATININE 0.87 03/23/2021   BUN 14 03/23/2021   CO2 30 03/23/2021   TSH 2.20 11/08/2018   HGBA1C 6.2 03/23/2021   MICROALBUR 5.3 (H) 03/23/2021         Assessment & Plan:   Physical exam: Screening blood work  ordered Exercise  regular - gym at least times a week Weight  working on weight loss - on mounjaro Substance abuse  none   Reviewed recommended immunizations.  Refer to GI for colonoscopy  Has eye appt in July   Health Maintenance  Topic Date Due   FOOT EXAM  Never done   OPHTHALMOLOGY EXAM  Never done   PAP SMEAR-Modifier  Never done   MAMMOGRAM  03/12/2016   COLONOSCOPY (Pts 45-68yr Insurance coverage will need to be confirmed)  08/28/2019   COVID-19 Vaccine (1) 07/10/2021 (Originally 11/20/1960)   Zoster Vaccines- Shingrix (1 of 2) 09/24/2021 (Originally 05/20/1979)   INFLUENZA VACCINE  08/31/2021   HEMOGLOBIN A1C  09/20/2021   TETANUS/TDAP  07/14/2029   Hepatitis C Screening  Completed   HIV Screening  Completed   HPV VACCINES  Aged Out          See Problem List for Assessment and Plan of chronic medical problems.

## 2021-06-24 ENCOUNTER — Ambulatory Visit (INDEPENDENT_AMBULATORY_CARE_PROVIDER_SITE_OTHER): Payer: 59 | Admitting: Internal Medicine

## 2021-06-24 VITALS — BP 140/78 | HR 83 | Temp 98.6°F | Ht 65.0 in | Wt 281.0 lb

## 2021-06-24 DIAGNOSIS — E119 Type 2 diabetes mellitus without complications: Secondary | ICD-10-CM

## 2021-06-24 DIAGNOSIS — D519 Vitamin B12 deficiency anemia, unspecified: Secondary | ICD-10-CM | POA: Diagnosis not present

## 2021-06-24 DIAGNOSIS — Z0000 Encounter for general adult medical examination without abnormal findings: Secondary | ICD-10-CM | POA: Diagnosis not present

## 2021-06-24 DIAGNOSIS — Z1211 Encounter for screening for malignant neoplasm of colon: Secondary | ICD-10-CM

## 2021-06-24 DIAGNOSIS — I10 Essential (primary) hypertension: Secondary | ICD-10-CM | POA: Diagnosis not present

## 2021-06-24 LAB — LIPID PANEL
Cholesterol: 166 mg/dL (ref 0–200)
HDL: 50 mg/dL (ref >39.00)
LDL Cholesterol: 101 mg/dL — ABNORMAL HIGH (ref 0–99)
NonHDL: 115.95
Total CHOL/HDL Ratio: 3
Triglycerides: 77 mg/dL (ref 0.0–149.0)
VLDL: 15.4 mg/dL (ref 0.0–40.0)

## 2021-06-24 LAB — COMPREHENSIVE METABOLIC PANEL
ALT: 31 U/L (ref 0–35)
AST: 29 U/L (ref 0–37)
Albumin: 4.4 g/dL (ref 3.5–5.2)
Alkaline Phosphatase: 44 U/L (ref 39–117)
BUN: 16 mg/dL (ref 6–23)
CO2: 28 mEq/L (ref 19–32)
Calcium: 11.2 mg/dL — ABNORMAL HIGH (ref 8.4–10.5)
Chloride: 102 mEq/L (ref 96–112)
Creatinine, Ser: 0.95 mg/dL (ref 0.40–1.20)
GFR: 63.95 mL/min (ref >60.00)
Glucose, Bld: 93 mg/dL (ref 70–99)
Potassium: 3.6 mEq/L (ref 3.5–5.1)
Sodium: 138 mEq/L (ref 135–145)
Total Bilirubin: 0.6 mg/dL (ref 0.2–1.2)
Total Protein: 7.6 g/dL (ref 6.0–8.3)

## 2021-06-24 LAB — TSH: TSH: 1.87 u[IU]/mL (ref 0.35–5.50)

## 2021-06-24 LAB — HEMOGLOBIN A1C: Hgb A1c MFr Bld: 5.6 % (ref 4.6–6.5)

## 2021-06-24 MED ORDER — TIRZEPATIDE 7.5 MG/0.5ML ~~LOC~~ SOAJ: 7.5000 mg | SUBCUTANEOUS | 0 refills | Status: DC

## 2021-06-24 NOTE — Assessment & Plan Note (Addendum)
Chronic  Lab Results  Component Value Date   HGBA1C 6.2 03/23/2021   Sugars well controlled Has lost 35 lbs Check A1c Continue Mounjaro  - increase to 7.5 mg weekly Stressed regular exercise, diabetic diet

## 2021-06-24 NOTE — Assessment & Plan Note (Signed)
Chronic Last blood work was normal Continue B12 supplementation

## 2021-06-24 NOTE — Assessment & Plan Note (Addendum)
Chronic Blood pressure improved, but still slightly elevated Has lost 35 lbs and working on further weight loss No change in medication today CMP Continue amlodipine-olmesartan 10-40 mg daily, HCTZ 25 mg daily, metoprolol XL 200 mg daily

## 2021-06-24 NOTE — Assessment & Plan Note (Addendum)
Chronic Working on weight loss - has lost about 35 lbs Taking Mounjaro 5 mg weekly-we will titrate as tolerated Stressed regular exercise-goal 30 minutes 5 days a week Diet low in sugars and carbs, decrease portions

## 2021-08-11 ENCOUNTER — Other Ambulatory Visit: Payer: Self-pay | Admitting: Internal Medicine

## 2021-08-20 ENCOUNTER — Other Ambulatory Visit (HOSPITAL_COMMUNITY): Payer: Self-pay

## 2021-09-07 ENCOUNTER — Telehealth: Payer: Self-pay | Admitting: Internal Medicine

## 2021-09-07 NOTE — Telephone Encounter (Signed)
Patient needs help getting her Darcel Bayley - It is going to cost her over $200 a month.  Do we have coupons or is there a program to help pay for this medication?

## 2021-09-07 NOTE — Telephone Encounter (Signed)
Spoke with patient today.  Number given to her today to contact [place] for patient assistance or to see if she would qualify for another discount card

## 2021-09-08 NOTE — Telephone Encounter (Signed)
Please call patient back concerning mounjaro.

## 2021-09-09 ENCOUNTER — Other Ambulatory Visit: Payer: Self-pay

## 2021-09-09 MED ORDER — TIRZEPATIDE 10 MG/0.5ML ~~LOC~~ SOAJ: 10.0000 mg | SUBCUTANEOUS | 0 refills | Status: DC

## 2021-09-09 NOTE — Telephone Encounter (Signed)
Spoke with patient today.  She was ready to increase dose up to 10 mg so new script sent in.   She will not need script until September but wanted it sent in just incase there was a Event organiser.

## 2021-09-18 ENCOUNTER — Other Ambulatory Visit: Payer: Self-pay | Admitting: Internal Medicine

## 2021-09-20 NOTE — Telephone Encounter (Signed)
NOTE NOT NEEDED ?

## 2021-10-06 ENCOUNTER — Other Ambulatory Visit: Payer: Self-pay

## 2021-10-06 ENCOUNTER — Telehealth: Payer: Self-pay

## 2021-10-06 MED ORDER — TIRZEPATIDE 10 MG/0.5ML ~~LOC~~ SOAJ: 10.0000 mg | SUBCUTANEOUS | 0 refills | Status: DC

## 2021-10-06 NOTE — Telephone Encounter (Signed)
Patient called back and has still not received her mounjaro - what should she do - [facility] on [address] is telling her that she needs a prior authorization.  [facility] does not have this and she needs to know what to do.  Patient has enough for this week.  _ Please advise.

## 2021-10-06 NOTE — Telephone Encounter (Signed)
Rebecca Foley (Key: [id number])

## 2021-10-06 NOTE — Telephone Encounter (Signed)
Sent in for patient today. 

## 2021-10-06 NOTE — Telephone Encounter (Signed)
Patient called back and said that [facility] on [address] in [place] has 2 left.  Please send rx there.

## 2021-11-05 ENCOUNTER — Telehealth: Payer: Self-pay | Admitting: Internal Medicine

## 2021-11-05 MED ORDER — TIRZEPATIDE 10 MG/0.5ML ~~LOC~~ SOAJ: 10.0000 mg | SUBCUTANEOUS | 0 refills | Status: DC

## 2021-11-05 NOTE — Telephone Encounter (Signed)
[facility]o [facility] g [facility]

## 2021-11-05 NOTE — Telephone Encounter (Signed)
Patient needs her mounjaro 10 mg. - refilled [facility] [address]

## 2021-12-12 ENCOUNTER — Other Ambulatory Visit: Payer: Self-pay | Admitting: Internal Medicine

## 2022-01-03 ENCOUNTER — Ambulatory Visit: Payer: 59 | Admitting: Internal Medicine

## 2022-02-03 ENCOUNTER — Encounter: Payer: Self-pay | Admitting: Internal Medicine

## 2022-02-03 NOTE — Patient Instructions (Addendum)
     You are overdue for colonoscopy-consider calling GI to schedule this -- Phone: [phone]    Blood work was ordered.   The lab is on the first floor.    Medications changes include :   none     Return in about 6 months (around 08/05/2022) for Physical Exam.

## 2022-02-03 NOTE — Progress Notes (Signed)
Subjective:    Patient ID: Rebecca Foley, female    DOB: April 05, 1960, 62 y.o.   MRN: [id number]     HPI Rebecca Foley is here for follow up of her chronic medical problems, including htn, DM, obesity, B12 def, allergies, herpes   She is exercising - walking, water aerobics.  No longer drinks soda, only drinks tea 2-3 a week - has cut down.   Drinking premier protein drinks.   BP controlled at home.   Not taking calcium supplementation.  Medications and allergies reviewed with patient and updated if appropriate.  Current Outpatient Medications on File Prior to Visit  Medication Sig Dispense Refill   amLODipine-olmesartan (AZOR) 10-40 MG tablet TAKE 1 TABLET BY MOUTH EVERY DAY 90 tablet 3   diclofenac sodium (VOLTAREN) 1 % GEL Apply 4 g topically 4 (four) times daily. 4 Tube 0   fluticasone (FLONASE) 50 MCG/ACT nasal spray SPRAY 2 SPRAYS INTO EACH NOSTRIL EVERY DAY 48 mL 2   hydrochlorothiazide (HYDRODIURIL) 25 MG tablet Take 1 tablet (25 mg total) by mouth daily. 90 tablet 3   ibuprofen (ADVIL,MOTRIN) 200 MG tablet Take 800 mg by mouth every 6 (six) hours as needed (pain).     levocetirizine (XYZAL) 5 MG tablet TAKE 1 TABLET BY MOUTH EVERY DAY IN THE EVENING 90 tablet 1   metoprolol succinate (TOPROL-XL) 100 MG 24 hr tablet TAKE 2 TABLETS(200 MG) BY MOUTH DAILY WITH OR IMMEDIATELY FOLLOWING A MEAL 180 tablet 2   tirzepatide (MOUNJARO) 10 MG/0.5ML Pen Inject 10 mg into the skin once a week. 6 mL 0   vitamin B-12 (CYANOCOBALAMIN) 1000 MCG tablet Take 1 tablet (1,000 mcg total) by mouth daily.     No current facility-administered medications on file prior to visit.     Review of Systems  Constitutional:  Negative for fever.  Respiratory:  Negative for cough, shortness of breath and wheezing.   Cardiovascular:  Positive for leg swelling. Negative for chest pain and palpitations.  Gastrointestinal:  Positive for constipation (mild with mounjaro). Negative for abdominal pain and  nausea.       No gerd  Neurological:  Negative for light-headedness and headaches.       Objective:   Vitals:   02/04/22 0819  BP: 130/82  Pulse: 84  Temp: 98.1 F (36.7 C)  SpO2: 96%   BP Readings from Last 3 Encounters:  02/04/22 130/82  06/24/21 140/78  03/23/21 (!) 164/98   Wt Readings from Last 3 Encounters:  02/04/22 245 lb (111.1 kg)  06/24/21 281 lb (127.5 kg)  03/23/21 (!) 315 lb 6.4 oz (143.1 kg)   Body mass index is 40.77 kg/m.    Physical Exam Constitutional:      General: She is not in acute distress.    Appearance: Normal appearance.  HENT:     Head: Normocephalic and atraumatic.  Eyes:     Conjunctiva/sclera: Conjunctivae normal.  Cardiovascular:     Rate and Rhythm: Normal rate and regular rhythm.     Heart sounds: Normal heart sounds. No murmur heard. Pulmonary:     Effort: Pulmonary effort is normal. No respiratory distress.     Breath sounds: Normal breath sounds. No wheezing.  Musculoskeletal:     Cervical back: Neck supple.     Right lower leg: No edema.     Left lower leg: No edema.  Lymphadenopathy:     Cervical: No cervical adenopathy.  Skin:    General: Skin is warm  and dry.     Findings: No rash.  Neurological:     Mental Status: She is alert. Mental status is at baseline.  Psychiatric:        Mood and Affect: Mood normal.        Behavior: Behavior normal.        Lab Results  Component Value Date   WBC 6.0 03/23/2021   HGB 13.4 03/23/2021   HCT 41.0 03/23/2021   PLT 269.0 03/23/2021   GLUCOSE 93 06/24/2021   CHOL 166 06/24/2021   TRIG 77.0 06/24/2021   HDL 50.00 06/24/2021   LDLCALC 101 (H) 06/24/2021   ALT 31 06/24/2021   AST 29 06/24/2021   NA 138 06/24/2021   K 3.6 06/24/2021   CL 102 06/24/2021   CREATININE 0.95 06/24/2021   BUN 16 06/24/2021   CO2 28 06/24/2021   TSH 1.87 06/24/2021   HGBA1C 5.6 06/24/2021   MICROALBUR 5.3 (H) 03/23/2021     Assessment & Plan:    See Problem List for Assessment  and Plan of chronic medical problems.

## 2022-02-04 ENCOUNTER — Ambulatory Visit: Payer: 59 | Admitting: Internal Medicine

## 2022-02-04 VITALS — BP 130/82 | HR 84 | Temp 98.1°F | Ht 65.0 in | Wt 245.0 lb

## 2022-02-04 DIAGNOSIS — I10 Essential (primary) hypertension: Secondary | ICD-10-CM | POA: Diagnosis not present

## 2022-02-04 DIAGNOSIS — D519 Vitamin B12 deficiency anemia, unspecified: Secondary | ICD-10-CM

## 2022-02-04 DIAGNOSIS — B009 Herpesviral infection, unspecified: Secondary | ICD-10-CM

## 2022-02-04 DIAGNOSIS — E119 Type 2 diabetes mellitus without complications: Secondary | ICD-10-CM

## 2022-02-04 LAB — COMPREHENSIVE METABOLIC PANEL
ALT: 23 U/L (ref 0–35)
AST: 27 U/L (ref 0–37)
Albumin: 4.4 g/dL (ref 3.5–5.2)
Alkaline Phosphatase: 41 U/L (ref 39–117)
BUN: 22 mg/dL (ref 6–23)
CO2: 28 mEq/L (ref 19–32)
Calcium: 11.4 mg/dL — ABNORMAL HIGH (ref 8.4–10.5)
Chloride: 104 mEq/L (ref 96–112)
Creatinine, Ser: 0.91 mg/dL (ref 0.40–1.20)
GFR: 67.05 mL/min (ref >60.00)
Glucose, Bld: 93 mg/dL (ref 70–99)
Potassium: 4.1 mEq/L (ref 3.5–5.1)
Sodium: 138 mEq/L (ref 135–145)
Total Bilirubin: 0.6 mg/dL (ref 0.2–1.2)
Total Protein: 7.2 g/dL (ref 6.0–8.3)

## 2022-02-04 LAB — LIPID PANEL
Cholesterol: 178 mg/dL (ref 0–200)
HDL: 50.5 mg/dL (ref >39.00)
LDL Cholesterol: 114 mg/dL — ABNORMAL HIGH (ref 0–99)
NonHDL: 127.82
Total CHOL/HDL Ratio: 4
Triglycerides: 70 mg/dL (ref 0.0–149.0)
VLDL: 14 mg/dL (ref 0.0–40.0)

## 2022-02-04 LAB — HEMOGLOBIN A1C: Hgb A1c MFr Bld: 5.3 % (ref 4.6–6.5)

## 2022-02-04 MED ORDER — TIRZEPATIDE 10 MG/0.5ML ~~LOC~~ SOAJ: 10.0000 mg | SUBCUTANEOUS | 0 refills | Status: DC

## 2022-02-04 MED ORDER — METOPROLOL SUCCINATE ER 100 MG PO TB24: 100.0000 mg | ORAL_TABLET | Freq: Every day | ORAL | 2 refills | Status: DC

## 2022-02-04 NOTE — Assessment & Plan Note (Signed)
Chronic Actively working on weight loss Has been on Mounjaro and has been successful with weight loss Continue Mounjaro for diabetes and weight maintenance Stressed healthy diet, regular exercise

## 2022-02-04 NOTE — Assessment & Plan Note (Addendum)
Chronic Continue B12 supplementation 

## 2022-02-04 NOTE — Assessment & Plan Note (Signed)
Calcium level has been elevated and blood work Check PTH, She is on hydrochlorothiazide which may be contributing

## 2022-02-04 NOTE — Assessment & Plan Note (Addendum)
Chronic   Lab Results  Component Value Date   HGBA1C 5.6 06/24/2021   Sugars well controlled Check A1c Continue Mounjaro 10 mg weekly Discussed possible side effects of Mounjaro and dose we need for maintenance of sugars and weight Stressed regular exercise, diabetic diet

## 2022-02-04 NOTE — Assessment & Plan Note (Signed)
Chronic Blood pressure well controlled CMP Continue amlodipine-olmesartan 10-40 mg daily, HCTZ 25 mg daily, metoprolol XL 200 mg daily

## 2022-02-06 LAB — PTH, INTACT AND CALCIUM
Calcium: 11.1 mg/dL — ABNORMAL HIGH (ref 8.6–10.4)
PTH: 53 pg/mL (ref 16–77)

## 2022-02-08 ENCOUNTER — Telehealth: Payer: Self-pay | Admitting: Internal Medicine

## 2022-02-08 NOTE — Telephone Encounter (Signed)
[facility] and said [facility] the [facility] - Please send and let patient know it has been sent.  Patient's phone:  [phone]

## 2022-02-09 ENCOUNTER — Other Ambulatory Visit (HOSPITAL_COMMUNITY): Payer: Self-pay

## 2022-02-09 ENCOUNTER — Other Ambulatory Visit: Payer: Self-pay | Admitting: Internal Medicine

## 2022-02-09 NOTE — Telephone Encounter (Signed)
Patient Advocate Encounter   Received notification from [employer] that prior authorization for Mounjaro '10MG'$ /0.5ML pen is required.   PA submitted on 02/09/2022 Key [id number] Status is pending

## 2022-02-09 NOTE — Telephone Encounter (Signed)
Pharmacy Patient Advocate Encounter  Received notification from [facility] that the request for prior authorization for Rebecca Foley has been denied due to .    Calling insurance to ask for refax of initial denial letter.

## 2022-02-11 NOTE — Telephone Encounter (Signed)
Spoke with patient today.

## 2022-02-11 NOTE — Telephone Encounter (Signed)
Patient called to follow up and see what needs to happen next. Please update patient at [phone]

## 2022-03-12 ENCOUNTER — Other Ambulatory Visit: Payer: Self-pay | Admitting: Internal Medicine

## 2022-03-13 ENCOUNTER — Other Ambulatory Visit: Payer: Self-pay | Admitting: Internal Medicine

## 2022-03-14 ENCOUNTER — Other Ambulatory Visit: Payer: Self-pay

## 2022-04-19 ENCOUNTER — Other Ambulatory Visit: Payer: Self-pay | Admitting: Internal Medicine

## 2022-04-21 NOTE — Telephone Encounter (Signed)
Rec'd msg Product Backordered/Unavailable:BACK ORDER. Need alternative

## 2022-04-26 NOTE — Telephone Encounter (Signed)
Message left for patient to return call to clinic and let me know what she would like to do regarding current dosage.

## 2022-04-27 ENCOUNTER — Encounter: Payer: Self-pay | Admitting: Internal Medicine

## 2022-04-27 NOTE — Telephone Encounter (Signed)
Patient states that they do not have the lower dosage anywhere that she called.  Please advise.,

## 2022-06-13 ENCOUNTER — Other Ambulatory Visit: Payer: Self-pay | Admitting: Internal Medicine

## 2022-06-25 ENCOUNTER — Other Ambulatory Visit: Payer: Self-pay | Admitting: Internal Medicine

## 2022-08-30 NOTE — Patient Instructions (Addendum)
Shingles vaccine today   Blood work was ordered.   The lab is on the first floor.    Medications changes include : mounjaro 12.5 mg daily      A referral was ordered sports medicine, GI and someone will call you to schedule an appointment.   A mammogram was ordered.  Schedule a pap smear with Gyn.     Return in about 6 months (around 03/03/2023) for follow up.    Health Maintenance, Female Adopting a healthy lifestyle and getting preventive care are important in promoting health and wellness. Ask your health care provider about: The right schedule for you to have regular tests and exams. Things you can do on your own to prevent diseases and keep yourself healthy. What should I know about diet, weight, and exercise? Eat a healthy diet  Eat a diet that includes plenty of vegetables, fruits, low-fat dairy products, and lean protein. Do not eat a lot of foods that are high in solid fats, added sugars, or sodium. Maintain a healthy weight Body mass index (BMI) is used to identify weight problems. It estimates body fat based on height and weight. Your health care provider can help determine your BMI and help you achieve or maintain a healthy weight. Get regular exercise Get regular exercise. This is one of the most important things you can do for your health. Most adults should: Exercise for at least 150 minutes each week. The exercise should increase your heart rate and make you sweat (moderate-intensity exercise). Do strengthening exercises at least twice a week. This is in addition to the moderate-intensity exercise. Spend less time sitting. Even light physical activity can be beneficial. Watch cholesterol and blood lipids Have your blood tested for lipids and cholesterol at 64 years of age, then have this test every 5 years. Have your cholesterol levels checked more often if: Your lipid or cholesterol levels are high. You are older than 64 years of age. You are at high  risk for heart disease. What should I know about cancer screening? Depending on your health history and family history, you may need to have cancer screening at various ages. This may include screening for: Breast cancer. Cervical cancer. Colorectal cancer. Skin cancer. Lung cancer. What should I know about heart disease, diabetes, and high blood pressure? Blood pressure and heart disease High blood pressure causes heart disease and increases the risk of stroke. This is more likely to develop in people who have high blood pressure readings or are overweight. Have your blood pressure checked: Every 3-5 years if you are 18-46 years of age. Every year if you are 40 years old or older. Diabetes Have regular diabetes screenings. This checks your fasting blood sugar level. Have the screening done: Once every three years after age 18 if you are at a normal weight and have a low risk for diabetes. More often and at a younger age if you are overweight or have a high risk for diabetes. What should I know about preventing infection? Hepatitis B If you have a higher risk for hepatitis B, you should be screened for this virus. Talk with your health care provider to find out if you are at risk for hepatitis B infection. Hepatitis C Testing is recommended for: Everyone born from [birth year] through 1967. Anyone with known risk factors for hepatitis C. Sexually transmitted infections (STIs) Get screened for STIs, including gonorrhea and chlamydia, if: You are sexually active and are younger than 64 years of age.  You are older than 64 years of age and your health care provider tells you that you are at risk for this type of infection. Your sexual activity has changed since you were last screened, and you are at increased risk for chlamydia or gonorrhea. Ask your health care provider if you are at risk. Ask your health care provider about whether you are at high risk for HIV. Your health care provider may  recommend a prescription medicine to help prevent HIV infection. If you choose to take medicine to prevent HIV, you should first get tested for HIV. You should then be tested every 3 months for as long as you are taking the medicine. Pregnancy If you are about to stop having your period (premenopausal) and you may become pregnant, seek counseling before you get pregnant. Take 400 to 800 micrograms (mcg) of folic acid every day if you become pregnant. Ask for birth control (contraception) if you want to prevent pregnancy. Osteoporosis and menopause Osteoporosis is a disease in which the bones lose minerals and strength with aging. This can result in bone fractures. If you are 16 years old or older, or if you are at risk for osteoporosis and fractures, ask your health care provider if you should: Be screened for bone loss. Take a calcium or vitamin D supplement to lower your risk of fractures. Be given hormone replacement therapy (HRT) to treat symptoms of menopause. Follow these instructions at home: Alcohol use Do not drink alcohol if: Your health care provider tells you not to drink. You are pregnant, may be pregnant, or are planning to become pregnant. If you drink alcohol: Limit how much you have to: 0-1 drink a day. Know how much alcohol is in your drink. In the [place] one drink equals one 12 oz bottle of beer (355 mL), one 5 oz glass of wine (148 mL), or one 1 oz glass of hard liquor (44 mL). Lifestyle Do not use any products that contain nicotine or tobacco. These products include cigarettes, chewing tobacco, and vaping devices, such as e-cigarettes. If you need help quitting, ask your health care provider. Do not use street drugs. Do not share needles. Ask your health care provider for help if you need support or information about quitting drugs. General instructions Schedule regular health, dental, and eye exams. Stay current with your vaccines. Tell your health care provider  if: You often feel depressed. You have ever been abused or do not feel safe at home. Summary Adopting a healthy lifestyle and getting preventive care are important in promoting health and wellness. Follow your health care provider's instructions about healthy diet, exercising, and getting tested or screened for diseases. Follow your health care provider's instructions on monitoring your cholesterol and blood pressure. This information is not intended to replace advice given to you by your health care provider. Make sure you discuss any questions you have with your health care provider. Document Revised: 06/08/2020 Document Reviewed: 06/08/2020 Elsevier Patient Education  2024 [employer].

## 2022-08-30 NOTE — Progress Notes (Unsigned)
Subjective:    Patient ID: Rebecca Foley, female    DOB: 06-23-1960, 62 y.o.   MRN: [id number]      HPI Rebecca Foley is here for a Physical exam and her chronic medical problems.     Left shoulder is getting stiff and pain with  laying on it.  ROM pretty good.  Also has some right knee pain which is chronic and not going away  Medications and allergies reviewed with patient and updated if appropriate.  Current Outpatient Medications on File Prior to Visit  Medication Sig Dispense Refill   diclofenac sodium (VOLTAREN) 1 % GEL Apply 4 g topically 4 (four) times daily. 4 Tube 0   fluticasone (FLONASE) 50 MCG/ACT nasal spray SPRAY 2 SPRAYS INTO EACH NOSTRIL EVERY DAY 48 mL 2   hydrochlorothiazide (HYDRODIURIL) 25 MG tablet TAKE 1 TABLET(25 MG) BY MOUTH DAILY 90 tablet 3   ibuprofen (ADVIL,MOTRIN) 200 MG tablet Take 800 mg by mouth every 6 (six) hours as needed (pain).     vitamin B-12 (CYANOCOBALAMIN) 1000 MCG tablet Take 1 tablet (1,000 mcg total) by mouth daily.     No current facility-administered medications on file prior to visit.    Review of Systems  Constitutional:  Negative for fever.  Eyes:  Negative for visual disturbance.  Respiratory:  Negative for cough, shortness of breath and wheezing.   Cardiovascular:  Negative for chest pain, palpitations and leg swelling.  Gastrointestinal:  Positive for constipation (mild - with mounjaro). Negative for abdominal pain, blood in stool and diarrhea.       No gerd  Genitourinary:  Negative for dysuria.  Musculoskeletal:  Positive for arthralgias. Negative for back pain.  Skin:  Negative for rash.  Neurological:  Negative for light-headedness and headaches.  Psychiatric/Behavioral:  Negative for dysphoric mood. The patient is not nervous/anxious.        Objective:   Vitals:   08/31/22 0828  BP: 130/74  Pulse: 66  Temp: 98.3 F (36.8 C)  SpO2: 98%   Filed Weights   08/31/22 0828  Weight: 233 lb (105.7 kg)   Body  mass index is 38.77 kg/m.  BP Readings from Last 3 Encounters:  08/31/22 130/74  02/04/22 130/82  06/24/21 140/78    Wt Readings from Last 3 Encounters:  08/31/22 233 lb (105.7 kg)  02/04/22 245 lb (111.1 kg)  06/24/21 281 lb (127.5 kg)       Physical Exam Constitutional: She appears well-developed and well-nourished. No distress.  HENT:  Head: Normocephalic and atraumatic.  Right Ear: External ear normal. Normal ear canal and TM Left Ear: External ear normal.  Normal ear canal and TM Mouth/Throat: Oropharynx is clear and moist.  Eyes: Conjunctivae normal.  Neck: Neck supple. No tracheal deviation present. No thyromegaly present.  No carotid bruit  Cardiovascular: Normal rate, regular rhythm and normal heart sounds.   No murmur heard.  No edema. Pulmonary/Chest: Effort normal and breath sounds normal. No respiratory distress. She has no wheezes. She has no rales.  Breast: deferred   Abdominal: Soft. She exhibits no distension. There is no tenderness.  Lymphadenopathy: She has no cervical adenopathy.  Skin: Skin is warm and dry. She is not diaphoretic.  Psychiatric: She has a normal mood and affect. Her behavior is normal.     Lab Results  Component Value Date   WBC 6.0 03/23/2021   HGB 13.4 03/23/2021   HCT 41.0 03/23/2021   PLT 269.0 03/23/2021   GLUCOSE 93 02/04/2022  CHOL 178 02/04/2022   TRIG 70.0 02/04/2022   HDL 50.50 02/04/2022   LDLCALC 114 (H) 02/04/2022   ALT 23 02/04/2022   AST 27 02/04/2022   NA 138 02/04/2022   K 4.1 02/04/2022   CL 104 02/04/2022   CREATININE 0.91 02/04/2022   BUN 22 02/04/2022   CO2 28 02/04/2022   TSH 1.87 06/24/2021   HGBA1C 5.3 02/04/2022   MICROALBUR 5.3 (H) 03/23/2021         Assessment & Plan:   Physical exam: Screening blood work  ordered Exercise  gym 5-7 days a week Weight  working on weight loss Substance abuse  none   Reviewed recommended immunizations.   Health Maintenance  Topic Date Due    FOOT EXAM  Never done   OPHTHALMOLOGY EXAM  Never done   PAP SMEAR-Modifier  Never done   Zoster Vaccines- Shingrix (1 of 2) Never done   MAMMOGRAM  03/12/2016   Colonoscopy  08/28/2019   COVID-19 Vaccine (2 - 2023-24 season) 12/20/2021   Diabetic kidney evaluation - Urine ACR  03/23/2022   HEMOGLOBIN A1C  08/05/2022   INFLUENZA VACCINE  09/01/2022   Diabetic kidney evaluation - eGFR measurement  02/05/2023   DTaP/Tdap/Td (2 - Td or Tdap) 07/14/2029   Hepatitis C Screening  Completed   HIV Screening  Completed   HPV VACCINES  Aged Out          See Problem List for Assessment and Plan of chronic medical problems.

## 2022-08-31 ENCOUNTER — Ambulatory Visit: Payer: 59 | Admitting: Internal Medicine

## 2022-08-31 ENCOUNTER — Other Ambulatory Visit: Payer: Self-pay

## 2022-08-31 ENCOUNTER — Encounter: Payer: Self-pay | Admitting: Internal Medicine

## 2022-08-31 ENCOUNTER — Telehealth: Payer: Self-pay | Admitting: Internal Medicine

## 2022-08-31 VITALS — BP 130/74 | HR 66 | Temp 98.3°F | Ht 65.0 in | Wt 233.0 lb

## 2022-08-31 DIAGNOSIS — D519 Vitamin B12 deficiency anemia, unspecified: Secondary | ICD-10-CM

## 2022-08-31 DIAGNOSIS — E119 Type 2 diabetes mellitus without complications: Secondary | ICD-10-CM

## 2022-08-31 DIAGNOSIS — Z0000 Encounter for general adult medical examination without abnormal findings: Secondary | ICD-10-CM | POA: Diagnosis not present

## 2022-08-31 DIAGNOSIS — G8929 Other chronic pain: Secondary | ICD-10-CM

## 2022-08-31 DIAGNOSIS — E7800 Pure hypercholesterolemia, unspecified: Secondary | ICD-10-CM

## 2022-08-31 DIAGNOSIS — E1169 Type 2 diabetes mellitus with other specified complication: Secondary | ICD-10-CM | POA: Insufficient documentation

## 2022-08-31 DIAGNOSIS — Z23 Encounter for immunization: Secondary | ICD-10-CM

## 2022-08-31 DIAGNOSIS — I10 Essential (primary) hypertension: Secondary | ICD-10-CM

## 2022-08-31 DIAGNOSIS — Z7985 Long-term (current) use of injectable non-insulin antidiabetic drugs: Secondary | ICD-10-CM | POA: Diagnosis not present

## 2022-08-31 DIAGNOSIS — Z86010 Personal history of colonic polyps: Secondary | ICD-10-CM

## 2022-08-31 DIAGNOSIS — Z1231 Encounter for screening mammogram for malignant neoplasm of breast: Secondary | ICD-10-CM

## 2022-08-31 DIAGNOSIS — E785 Hyperlipidemia, unspecified: Secondary | ICD-10-CM | POA: Insufficient documentation

## 2022-08-31 LAB — COMPREHENSIVE METABOLIC PANEL
ALT: 17 U/L (ref 0–35)
AST: 21 U/L (ref 0–37)
Albumin: 4.3 g/dL (ref 3.5–5.2)
Alkaline Phosphatase: 41 U/L (ref 39–117)
BUN: 18 mg/dL (ref 6–23)
CO2: 27 mEq/L (ref 19–32)
Calcium: 11 mg/dL — ABNORMAL HIGH (ref 8.4–10.5)
Chloride: 105 mEq/L (ref 96–112)
Creatinine, Ser: 0.84 mg/dL (ref 0.40–1.20)
GFR: 73.51 mL/min (ref >60.00)
Glucose, Bld: 90 mg/dL (ref 70–99)
Potassium: 4 mEq/L (ref 3.5–5.1)
Sodium: 140 mEq/L (ref 135–145)
Total Bilirubin: 0.5 mg/dL (ref 0.2–1.2)
Total Protein: 7.5 g/dL (ref 6.0–8.3)

## 2022-08-31 LAB — CBC WITH DIFFERENTIAL/PLATELET
Basophils Absolute: 0 10*3/uL (ref 0.0–0.1)
Basophils Relative: 0.6 % (ref 0.0–3.0)
Eosinophils Absolute: 0.1 10*3/uL (ref 0.0–0.7)
Eosinophils Relative: 1.6 % (ref 0.0–5.0)
HCT: 39.4 % (ref 36.0–46.0)
Hemoglobin: 13.3 g/dL (ref 12.0–15.0)
Lymphocytes Relative: 26.4 % (ref 12.0–46.0)
Lymphs Abs: 1.3 10*3/uL (ref 0.7–4.0)
MCHC: 33.8 g/dL (ref 30.0–36.0)
MCV: 91.1 fl (ref 78.0–100.0)
Monocytes Absolute: 0.5 10*3/uL (ref 0.1–1.0)
Monocytes Relative: 11 % (ref 3.0–12.0)
Neutro Abs: 2.9 10*3/uL (ref 1.4–7.7)
Neutrophils Relative %: 60.4 % (ref 43.0–77.0)
Platelets: 254 10*3/uL (ref 150.0–400.0)
RBC: 4.32 Mil/uL (ref 3.87–5.11)
RDW: 12.8 % (ref 11.5–15.5)
WBC: 4.8 10*3/uL (ref 4.0–10.5)

## 2022-08-31 LAB — VITAMIN B12: Vitamin B-12: 1321 pg/mL — ABNORMAL HIGH (ref 211–911)

## 2022-08-31 LAB — LIPID PANEL
Cholesterol: 169 mg/dL (ref 0–200)
HDL: 56.6 mg/dL (ref >39.00)
LDL Cholesterol: 100 mg/dL — ABNORMAL HIGH (ref 0–99)
NonHDL: 112.16
Total CHOL/HDL Ratio: 3
Triglycerides: 62 mg/dL (ref 0.0–149.0)
VLDL: 12.4 mg/dL (ref 0.0–40.0)

## 2022-08-31 LAB — TSH: TSH: 2.38 u[IU]/mL (ref 0.35–5.50)

## 2022-08-31 LAB — MICROALBUMIN / CREATININE URINE RATIO
Creatinine,U: 77.2 mg/dL
Microalb Creat Ratio: 0.9 mg/g (ref 0.0–30.0)
Microalb, Ur: <0.7 mg/dL (ref 0.0–1.9)

## 2022-08-31 LAB — HEMOGLOBIN A1C: Hgb A1c MFr Bld: 5.4 % (ref 4.6–6.5)

## 2022-08-31 MED ORDER — TIRZEPATIDE 12.5 MG/0.5ML ~~LOC~~ SOAJ: 12.5000 mg | SUBCUTANEOUS | 1 refills | Status: DC

## 2022-08-31 MED ORDER — METOPROLOL SUCCINATE ER 100 MG PO TB24: 100.0000 mg | ORAL_TABLET | Freq: Every day | ORAL | 2 refills | Status: DC

## 2022-08-31 MED ORDER — AMLODIPINE-OLMESARTAN 10-40 MG PO TABS: 1.0000 | ORAL_TABLET | Freq: Every day | ORAL | 3 refills | Status: DC

## 2022-08-31 MED ORDER — LEVOCETIRIZINE DIHYDROCHLORIDE 5 MG PO TABS: ORAL_TABLET | ORAL | 1 refills | Status: DC

## 2022-08-31 MED ORDER — TIRZEPATIDE 12.5 MG/0.5ML ~~LOC~~ SOAJ: 12.5000 mg | SUBCUTANEOUS | 5 refills | Status: DC

## 2022-08-31 NOTE — Assessment & Plan Note (Addendum)
Chronic   Lab Results  Component Value Date   HGBA1C 5.3 02/04/2022   Sugars well controlled Check A1c, lipid panel, CBC Continue Mounjaro-increase to 12.5 mg weekly to help more with weight loss and continue to keep sugars controlled Stressed regular exercise, diabetic diet

## 2022-08-31 NOTE — Assessment & Plan Note (Signed)
Chronic Continue B12 supplementation Will check level

## 2022-08-31 NOTE — Assessment & Plan Note (Addendum)
Chronic Actively working on weight loss Has been on Mounjaro and has been successful with weight loss Continue Mounjaro for diabetes and weight maintenance-currently on 10 mg weekly-will increase to 12.5 mg weekly to help further with weight loss Stressed healthy diet, regular exercise Check TSH

## 2022-08-31 NOTE — Telephone Encounter (Signed)
Patient was seen today - she states that prescriptions were sent to the wrong pharmacy - please send them to [facility] on [address] in [place].  She does not want to pick up todays scripts at other location

## 2022-08-31 NOTE — Assessment & Plan Note (Signed)
Chronic Cholesterol slightly higher than ideal Has diabetes, hypertension Check lipid panel Consider Crestor 5 mg daily

## 2022-08-31 NOTE — Telephone Encounter (Signed)
Scripts sent to pharmacy requested

## 2022-08-31 NOTE — Assessment & Plan Note (Signed)
Chronic Blood pressure well controlled CMP, CBC Continue amlodipine-olmesartan 10-40 mg daily, HCTZ 25 mg daily, metoprolol XL 200 mg daily

## 2022-09-02 ENCOUNTER — Other Ambulatory Visit: Payer: Self-pay | Admitting: Internal Medicine

## 2022-09-02 ENCOUNTER — Other Ambulatory Visit: Payer: Self-pay

## 2022-09-02 MED ORDER — MOUNJARO 10 MG/0.5ML ~~LOC~~ SOAJ: 10.0000 mg | SUBCUTANEOUS | 0 refills | Status: DC

## 2022-11-15 ENCOUNTER — Other Ambulatory Visit: Payer: Self-pay | Admitting: Internal Medicine

## 2022-12-10 ENCOUNTER — Other Ambulatory Visit: Payer: Self-pay | Admitting: Internal Medicine

## 2023-02-24 ENCOUNTER — Other Ambulatory Visit: Payer: Self-pay | Admitting: Internal Medicine

## 2023-03-02 ENCOUNTER — Encounter: Payer: Self-pay | Admitting: Internal Medicine

## 2023-03-02 NOTE — Patient Instructions (Addendum)
   Make an appointment with sports medicine downstairs - left upper arm pain.    Blood work was ordered.       Medications changes include :   triamcinolone cream, mounjaro 12.5 mg, start crestor 5 mg three times a week    A referral was ordered for sports medicine and someone will call you to schedule an appointment.     Return in about 6 months (around 08/31/2023) for Physical Exam.

## 2023-03-02 NOTE — Progress Notes (Signed)
Subjective:    Patient ID: Rebecca Foley, female    DOB: 06-29-1960, 63 y.o.   MRN: [id number]     HPI Talani is here for follow up of her chronic medical problems.   Itchy, dry patches of skin - has been there a while.  Putting oil and eczema lotion on it.  It helps a little.  Worse after being in pool.    Exercising regularly - water aerobics twice a week, walks 4 miles 2-3 times a week   Medications and allergies reviewed with patient and updated if appropriate.  Current Outpatient Medications on File Prior to Visit  Medication Sig Dispense Refill   amLODipine-olmesartan (AZOR) 10-40 MG tablet Take 1 tablet by mouth daily. 90 tablet 3   diclofenac sodium (VOLTAREN) 1 % GEL Apply 4 g topically 4 (four) times daily. 4 Tube 0   fluticasone (FLONASE) 50 MCG/ACT nasal spray SPRAY 2 SPRAYS INTO EACH NOSTRIL EVERY DAY 48 mL 2   hydrochlorothiazide (HYDRODIURIL) 25 MG tablet TAKE 1 TABLET(25 MG) BY MOUTH DAILY 90 tablet 3   ibuprofen (ADVIL,MOTRIN) 200 MG tablet Take 800 mg by mouth every 6 (six) hours as needed (pain).     levocetirizine (XYZAL) 5 MG tablet TAKE 1 TABLET BY MOUTH EVERY DAY IN THE EVENING 90 tablet 1   metoprolol succinate (TOPROL-XL) 100 MG 24 hr tablet TAKE 1 TABLET BY MOUTH DAILY. TAKE WITH OR IMMEDIATELY FOLLOWING A MEAL. 90 tablet 2   vitamin B-12 (CYANOCOBALAMIN) 1000 MCG tablet Take 1 tablet (1,000 mcg total) by mouth daily.     No current facility-administered medications on file prior to visit.     Review of Systems  Constitutional:  Negative for fever.  HENT:  Negative for postnasal drip.   Respiratory:  Negative for cough, shortness of breath and wheezing.        Some chest congestion in morning only  Cardiovascular:  Negative for chest pain, palpitations and leg swelling.  Musculoskeletal:        Left upper arm pain  Skin:  Positive for rash.  Neurological:  Negative for light-headedness and headaches.       Objective:   Vitals:    03/03/23 0829  BP: 130/74  Pulse: 70  Temp: 98.2 F (36.8 C)  SpO2: 98%   BP Readings from Last 3 Encounters:  03/03/23 130/74  08/31/22 130/74  02/04/22 130/82   Wt Readings from Last 3 Encounters:  03/03/23 236 lb (107 kg)  08/31/22 233 lb (105.7 kg)  02/04/22 245 lb (111.1 kg)   Body mass index is 39.27 kg/m.    Physical Exam Constitutional:      General: She is not in acute distress.    Appearance: Normal appearance.  HENT:     Head: Normocephalic and atraumatic.  Eyes:     Conjunctiva/sclera: Conjunctivae normal.  Cardiovascular:     Rate and Rhythm: Normal rate and regular rhythm.     Heart sounds: Normal heart sounds.  Pulmonary:     Effort: Pulmonary effort is normal. No respiratory distress.     Breath sounds: Normal breath sounds. No wheezing.  Musculoskeletal:     Cervical back: Neck supple.     Right lower leg: No edema.     Left lower leg: No edema.  Lymphadenopathy:     Cervical: No cervical adenopathy.  Skin:    General: Skin is warm and dry.     Findings: No rash.  Neurological:  Mental Status: She is alert. Mental status is at baseline.  Psychiatric:        Mood and Affect: Mood normal.        Behavior: Behavior normal.        Lab Results  Component Value Date   WBC 4.8 08/31/2022   HGB 13.3 08/31/2022   HCT 39.4 08/31/2022   PLT 254.0 08/31/2022   GLUCOSE 90 08/31/2022   CHOL 169 08/31/2022   TRIG 62.0 08/31/2022   HDL 56.60 08/31/2022   LDLCALC 100 (H) 08/31/2022   ALT 17 08/31/2022   AST 21 08/31/2022   NA 140 08/31/2022   K 4.0 08/31/2022   CL 105 08/31/2022   CREATININE 0.84 08/31/2022   BUN 18 08/31/2022   CO2 27 08/31/2022   TSH 2.38 08/31/2022   HGBA1C 5.4 08/31/2022   MICROALBUR <0.7 08/31/2022     Assessment & Plan:    Shingrix #2 today   See Problem List for Assessment and Plan of chronic medical problems.

## 2023-03-03 ENCOUNTER — Ambulatory Visit: Payer: 59 | Admitting: Internal Medicine

## 2023-03-03 ENCOUNTER — Other Ambulatory Visit (HOSPITAL_COMMUNITY): Payer: Self-pay

## 2023-03-03 VITALS — BP 130/74 | HR 70 | Temp 98.2°F | Ht 65.0 in | Wt 236.0 lb

## 2023-03-03 DIAGNOSIS — L309 Dermatitis, unspecified: Secondary | ICD-10-CM | POA: Insufficient documentation

## 2023-03-03 DIAGNOSIS — D519 Vitamin B12 deficiency anemia, unspecified: Secondary | ICD-10-CM

## 2023-03-03 DIAGNOSIS — E119 Type 2 diabetes mellitus without complications: Secondary | ICD-10-CM | POA: Diagnosis not present

## 2023-03-03 DIAGNOSIS — E7800 Pure hypercholesterolemia, unspecified: Secondary | ICD-10-CM

## 2023-03-03 DIAGNOSIS — Z7985 Long-term (current) use of injectable non-insulin antidiabetic drugs: Secondary | ICD-10-CM

## 2023-03-03 DIAGNOSIS — I10 Essential (primary) hypertension: Secondary | ICD-10-CM | POA: Diagnosis not present

## 2023-03-03 DIAGNOSIS — Z23 Encounter for immunization: Secondary | ICD-10-CM | POA: Diagnosis not present

## 2023-03-03 DIAGNOSIS — M79602 Pain in left arm: Secondary | ICD-10-CM

## 2023-03-03 LAB — COMPREHENSIVE METABOLIC PANEL
ALT: 16 U/L (ref 0–35)
AST: 18 U/L (ref 0–37)
Albumin: 4.6 g/dL (ref 3.5–5.2)
Alkaline Phosphatase: 50 U/L (ref 39–117)
BUN: 17 mg/dL (ref 6–23)
CO2: 29 meq/L (ref 19–32)
Calcium: 11 mg/dL — ABNORMAL HIGH (ref 8.4–10.5)
Chloride: 104 meq/L (ref 96–112)
Creatinine, Ser: 0.9 mg/dL (ref 0.40–1.20)
GFR: 67.43 mL/min (ref >60.00)
Glucose, Bld: 85 mg/dL (ref 70–99)
Potassium: 3.9 meq/L (ref 3.5–5.1)
Sodium: 140 meq/L (ref 135–145)
Total Bilirubin: 0.8 mg/dL (ref 0.2–1.2)
Total Protein: 7.7 g/dL (ref 6.0–8.3)

## 2023-03-03 LAB — LIPID PANEL
Cholesterol: 171 mg/dL (ref 0–200)
HDL: 57.2 mg/dL (ref >39.00)
LDL Cholesterol: 93 mg/dL (ref 0–99)
NonHDL: 113.81
Total CHOL/HDL Ratio: 3
Triglycerides: 103 mg/dL (ref 0.0–149.0)
VLDL: 20.6 mg/dL (ref 0.0–40.0)

## 2023-03-03 LAB — HEMOGLOBIN A1C: Hgb A1c MFr Bld: 5.6 % (ref 4.6–6.5)

## 2023-03-03 MED ORDER — ROSUVASTATIN CALCIUM 5 MG PO TABS
5.0000 mg | ORAL_TABLET | ORAL | 3 refills | Status: DC
  Filled 2023-03-03: qty 13, 31d supply, fill #0
  Filled 2023-03-29: qty 13, 31d supply, fill #1
  Filled 2023-04-28: qty 13, 31d supply, fill #2

## 2023-03-03 MED ORDER — MOUNJARO 12.5 MG/0.5ML ~~LOC~~ SOAJ
12.5000 mg | SUBCUTANEOUS | 2 refills | Status: DC
  Filled 2023-03-03: qty 2, 28d supply, fill #0
  Filled 2023-03-30: qty 2, 28d supply, fill #1
  Filled 2023-04-28: qty 2, 28d supply, fill #2

## 2023-03-03 MED ORDER — TRIAMCINOLONE ACETONIDE 0.1 % EX CREA
1.0000 | TOPICAL_CREAM | Freq: Two times a day (BID) | CUTANEOUS | 0 refills | Status: AC
Start: 2023-03-03 — End: ?
  Filled 2023-03-03: qty 30, 15d supply, fill #0

## 2023-03-03 NOTE — Assessment & Plan Note (Signed)
Chronic Actively working on weight loss Has been on Mounjaro and has been successful with weight loss Continue Mounjaro for diabetes and weight maintenance Stressed healthy diet, regular exercise

## 2023-03-03 NOTE — Assessment & Plan Note (Addendum)
Chronic Cholesterol slightly higher than ideal and has diabetes Has diabetes, hypertension Check lipid panel start Crestor 5 mg 3 times a week

## 2023-03-03 NOTE — Assessment & Plan Note (Signed)
Chronic Continue B12 supplementation 

## 2023-03-03 NOTE — Assessment & Plan Note (Addendum)
Chronic  Lab Results  Component Value Date   HGBA1C 5.4 08/31/2022   Sugars well controlled Check A1c, lipid panel Continue Mounjaro - increase to 12.5 mg weekly Stressed regular exercise, diabetic diet

## 2023-03-03 NOTE — Assessment & Plan Note (Addendum)
Chronic Blood pressure controlled CMP Continue amlodipine-olmesartan 10-40 mg daily, HCTZ 25 mg daily, metoprolol XL 200 mg daily

## 2023-03-03 NOTE — Assessment & Plan Note (Signed)
Pain from elbow to upper back Referral to sports med

## 2023-03-03 NOTE — Assessment & Plan Note (Signed)
New Dry, itchy patches in different areas of skin Probable Using eczema cream Triamcinolone cream bid prn - advised not to use more than 2 weeks at a time

## 2023-03-05 ENCOUNTER — Encounter: Payer: Self-pay | Admitting: Internal Medicine

## 2023-03-07 NOTE — Progress Notes (Signed)
 [facility] [address] [place] [address] Phone: [phone]   Assessment and Plan:     1. Chronic pain of right knee -Chronic with exacerbation, initial sports medicine visit - Consistent with flare of medial compartment osteoarthritis.  Overall, flare is fairly well-controlled with relative rest and intermittent ibuprofen use. - X-ray obtained in clinic.  Monitor rotation: No acute fracture or dislocation.  Degenerative changes that have advanced since x-ray in 2017, primarily in medial compartment - Start meloxicam  15 mg daily as needed for pain relief.  Recommend limiting chronic NSAIDs to 1-2 doses per week - Recommend using Tylenol  for day-to-day pain relief - Start HEP for knee  2. Left arm pain -Chronic with exacerbation, initial sports medicine visit - Consistent with subacromial bursitis versus rotator cuff strain leading to upper arm pain - X-ray obtained in clinic.  Monitor rotation: No acute fracture or dislocation. - Start meloxicam  15 mg daily as needed for pain relief.  Recommend limiting chronic NSAIDs to 1-2 doses per week - Recommend using Tylenol  for day-to-day pain relief - Start HEP for shoulder  15 additional minutes spent for educating Therapeutic Home Exercise Program.  This included exercises focusing on stretching, strengthening, with focus on eccentric aspects.   Long term goals include an improvement in range of motion, strength, endurance as well as avoiding reinjury. Patient's frequency would include in 1-2 times a day, 3-5 times a week for a duration of 6-12 weeks. Proper technique shown and discussed handout in great detail with ATC.  All questions were discussed and answered.     Pertinent previous records reviewed include none  Follow Up: As needed if no improvement or worsening of symptoms.  Could consider course of meloxicam  versus CSI versus physical therapy   Subjective:   I, Rebecca Foley, am serving as a neurosurgeon for Doctor Morene Mace  Chief Complaint: right knee and left arm pain   HPI:   03/08/2023 Patient is a 63 year old female with right knee and left arm pain. Patient states right knee. Oct?nov fell down the steps. It bothers her from time to time. Walking more now. Lean on elbow the arm will get sore and feels like pins throughout. Shoots up the arm tingles and stings. Sleeps on right side now because of discomfort. Duration couple months. It has gotten better and now it is just a some time it happens thing. Medications used NSAids. Patient states that the medication has helped. Pain is located more in the elbow.   Relevant Historical Information: DM type II, hypertension  Additional pertinent review of systems negative.   Current Outpatient Medications:    amLODipine -olmesartan  (AZOR ) 10-40 MG tablet, Take 1 tablet by mouth daily., Disp: 90 tablet, Rfl: 3   diclofenac  sodium (VOLTAREN ) 1 % GEL, Apply 4 g topically 4 (four) times daily., Disp: 4 Tube, Rfl: 0   fluticasone  (FLONASE ) 50 MCG/ACT nasal spray, SPRAY 2 SPRAYS INTO EACH NOSTRIL EVERY DAY, Disp: 48 mL, Rfl: 2   hydrochlorothiazide  (HYDRODIURIL ) 25 MG tablet, TAKE 1 TABLET(25 MG) BY MOUTH DAILY, Disp: 90 tablet, Rfl: 3   ibuprofen (ADVIL,MOTRIN) 200 MG tablet, Take 800 mg by mouth every 6 (six) hours as needed (pain)., Disp: , Rfl:    levocetirizine (XYZAL ) 5 MG tablet, TAKE 1 TABLET BY MOUTH EVERY DAY IN THE EVENING, Disp: 90 tablet, Rfl: 1   meloxicam  (MOBIC ) 15 MG tablet, Take 1 tablet (15 mg total) by mouth daily as  needed for pain., Disp: 30 tablet, Rfl: 0   metoprolol  succinate (TOPROL -XL) 100 MG 24 hr tablet, TAKE 1 TABLET BY MOUTH DAILY. TAKE WITH OR IMMEDIATELY FOLLOWING A MEAL., Disp: 90 tablet, Rfl: 2   rosuvastatin  (CRESTOR ) 5 MG tablet, Take 1 tablet (5 mg total) by mouth 3 (three) times a week., Disp: 13 tablet, Rfl: 3   tirzepatide  (MOUNJARO ) 12.5 MG/0.5ML Pen, Inject 12.5 mg into the  skin once a week., Disp: 2 mL, Rfl: 2   triamcinolone  cream (KENALOG ) 0.1 %, Apply 1 Application topically 2 (two) times daily., Disp: 30 g, Rfl: 0   vitamin B-12 (CYANOCOBALAMIN ) 1000 MCG tablet, Take 1 tablet (1,000 mcg total) by mouth daily., Disp: , Rfl:    Objective:     Vitals:   03/08/23 0900  BP: 130/70  Pulse: 92  SpO2: 96%  Weight: 238 lb (108 kg)  Height: 5' 5 (1.651 m)      Body mass index is 39.61 kg/m.    Physical Exam:    General:  awake, alert oriented, no acute distress nontoxic Skin: no suspicious lesions or rashes Neuro:sensation intact and strength 5/5 with no deficits, no atrophy, normal muscle tone Psych: No signs of anxiety, depression or other mood disorder  Right knee: No swelling No deformity Neg fluid wave, joint milking ROM Flex 110, Ext 0 TTP medial joint line and medial femoral condyle NTTP over the quad tendon, lat fem condyle, patella, plica, patella tendon, tibial tuberostiy, fibular head, posterior fossa, pes anserine bursa, gerdy's tubercle,   lateral jt line Neg anterior and posterior drawer Neg lachman Neg sag sign Negative varus stress Negative valgus stress Negative McMurray for palpable pop, though reproduced medial pain with medial stress Positive Thessaly  Gait normal   Left arm/shoulder:  No deformity, swelling or muscle wasting No scapular winging FF 180, abd 180, int 0, ext 90 NTTP over the [place], clavicle, ac, coracoid, biceps groove, humerus, deltoid, trapezius, cervical spine, medial epicondyle, lateral epicondyle Negative Tinel's at cubital tunnel Neg neer, hawkins, empty can, obriens, crossarm, subscap liftoff, speeds Neg ant drawer, sulcus sign, apprehension Negative Spurling's test bilat FROM of neck  Mild shoulder discomfort with resisted external rotation  Electronically signed by:  Odis Mace D.CLEMENTEEN [facility] 9:45 AM 03/08/23

## 2023-03-08 ENCOUNTER — Ambulatory Visit: Payer: 59 | Admitting: Sports Medicine

## 2023-03-08 ENCOUNTER — Ambulatory Visit (INDEPENDENT_AMBULATORY_CARE_PROVIDER_SITE_OTHER): Payer: 59

## 2023-03-08 VITALS — BP 130/70 | HR 92 | Ht 65.0 in | Wt 238.0 lb

## 2023-03-08 DIAGNOSIS — M25561 Pain in right knee: Secondary | ICD-10-CM | POA: Diagnosis not present

## 2023-03-08 DIAGNOSIS — M79602 Pain in left arm: Secondary | ICD-10-CM

## 2023-03-08 DIAGNOSIS — M1711 Unilateral primary osteoarthritis, right knee: Secondary | ICD-10-CM

## 2023-03-08 DIAGNOSIS — G8929 Other chronic pain: Secondary | ICD-10-CM

## 2023-03-08 MED ORDER — MELOXICAM 15 MG PO TABS: 15.0000 mg | ORAL_TABLET | Freq: Every day | ORAL | 0 refills | Status: DC | PRN

## 2023-03-08 NOTE — Patient Instructions (Addendum)
-   Start meloxicam  15 mg daily as needed. Maximum 1 to 2 per week.     Knee and shoulder HEP. Tylenol  for everyday relief.  Follow up as needed.

## 2023-03-11 ENCOUNTER — Other Ambulatory Visit: Payer: Self-pay | Admitting: Internal Medicine

## 2023-03-23 ENCOUNTER — Encounter: Payer: Self-pay | Admitting: Sports Medicine

## 2023-03-24 ENCOUNTER — Other Ambulatory Visit: Payer: Self-pay | Admitting: Internal Medicine

## 2023-03-29 ENCOUNTER — Other Ambulatory Visit (HOSPITAL_COMMUNITY): Payer: Self-pay

## 2023-03-30 ENCOUNTER — Other Ambulatory Visit (HOSPITAL_COMMUNITY): Payer: Self-pay

## 2023-04-04 ENCOUNTER — Other Ambulatory Visit (HOSPITAL_COMMUNITY): Payer: Self-pay

## 2023-04-04 ENCOUNTER — Other Ambulatory Visit: Payer: Self-pay | Admitting: Sports Medicine

## 2023-04-28 ENCOUNTER — Other Ambulatory Visit (HOSPITAL_COMMUNITY): Payer: Self-pay

## 2023-05-11 ENCOUNTER — Other Ambulatory Visit (HOSPITAL_COMMUNITY): Payer: Self-pay

## 2023-05-11 ENCOUNTER — Other Ambulatory Visit: Payer: Self-pay | Admitting: Internal Medicine

## 2023-05-11 MED ORDER — ROSUVASTATIN CALCIUM 5 MG PO TABS
5.0000 mg | ORAL_TABLET | ORAL | 3 refills | Status: DC
  Filled 2023-05-11: qty 13, 31d supply, fill #0
  Filled 2023-05-15: qty 36, 84d supply, fill #0
  Filled 2023-08-14: qty 13, 30d supply, fill #0
  Filled 2023-09-13 (×2): qty 3, 7d supply, fill #1

## 2023-05-11 MED ORDER — MOUNJARO 12.5 MG/0.5ML ~~LOC~~ SOAJ
12.5000 mg | SUBCUTANEOUS | 0 refills | Status: DC
  Filled 2023-05-11 – 2023-05-15 (×4): qty 6, 84d supply, fill #0

## 2023-05-15 ENCOUNTER — Other Ambulatory Visit (HOSPITAL_COMMUNITY): Payer: Self-pay

## 2023-05-15 ENCOUNTER — Telehealth: Payer: Self-pay

## 2023-05-15 NOTE — Telephone Encounter (Signed)
 Pharmacy Patient Advocate Encounter   Received notification from CoverMyMeds that prior authorization for Mounjaro 12.5MG /0.5ML auto-injectors is required/requested.   Insurance verification completed.   The patient is insured through [employer] .   Per test claim: PA required; PA submitted to above mentioned insurance via CoverMyMeds Key/confirmation #/EOC [id number] Status is pending

## 2023-05-15 NOTE — Telephone Encounter (Signed)
 Pharmacy Patient Advocate Encounter  Received notification from [employer] that Prior Authorization for Mounjaro 12.5MG /0.5ML auto-injectors has been APPROVED from 05/02/2023 to 05/14/2024   PA #/Case ID/Reference #: [id number]

## 2023-05-24 ENCOUNTER — Other Ambulatory Visit: Payer: Self-pay | Admitting: Internal Medicine

## 2023-05-24 MED ORDER — HYDROCHLOROTHIAZIDE 25 MG PO TABS: 25.0000 mg | ORAL_TABLET | Freq: Every day | ORAL | 3 refills | Status: DC

## 2023-05-24 MED ORDER — METOPROLOL SUCCINATE ER 100 MG PO TB24: 100.0000 mg | ORAL_TABLET | Freq: Every day | ORAL | 2 refills | Status: DC

## 2023-05-24 MED ORDER — AMLODIPINE-OLMESARTAN 10-40 MG PO TABS: 1.0000 | ORAL_TABLET | Freq: Every day | ORAL | 3 refills | Status: DC

## 2023-05-24 MED ORDER — LEVOCETIRIZINE DIHYDROCHLORIDE 5 MG PO TABS: 5.0000 mg | ORAL_TABLET | Freq: Every evening | ORAL | 1 refills | Status: DC

## 2023-05-24 NOTE — Telephone Encounter (Signed)
 Copied from CRM [id number]. Topic: Clinical - Medication Refill >> May 24, 2023 12:04 PM [name] wrote: Most Recent Primary Care Visit:  Provider: BURNS, Beckey Bourgeois  Department: [facility]  Visit Type: OFFICE VISIT  Date: 03/03/2023  Medication:  hydrochlorothiazide  (HYDRODIURIL ) 25 MG tablet metoprolol  succinate (TOPROL -XL) 100 MG 24 hr tablet amLODipine -olmesartan  (AZOR ) 10-40 MG tablet levocetirizine (XYZAL ) 5 MG tablet  Has the patient contacted their pharmacy? Yes (Agent: If no, request that the patient contact the pharmacy for the refill. If patient does not wish to contact the pharmacy document the reason why and proceed with request.) (Agent: If yes, when and what did the pharmacy advise?)  Is this the correct pharmacy for this prescription? Yes If no, delete pharmacy and type the correct one.  This is the patient's preferred pharmacy:    [facility] - [name], [place] - [address] [place] [address] Phone: [phone] Fax: [phone]   Has the prescription been filled recently? No  Is the patient out of the medication? ( need amlodipine )(need new prescription sent to new pharmacy)   Has the patient been seen for an appointment in the last year OR does the patient have an upcoming appointment? Yes  Can we respond through MyChart? Yes  Agent: Please be advised that Rx refills may take up to 3 business days. We ask that you follow-up with your pharmacy.

## 2023-08-14 ENCOUNTER — Other Ambulatory Visit (HOSPITAL_COMMUNITY): Payer: Self-pay

## 2023-08-14 ENCOUNTER — Other Ambulatory Visit: Payer: Self-pay | Admitting: Internal Medicine

## 2023-08-14 MED ORDER — MOUNJARO 12.5 MG/0.5ML ~~LOC~~ SOAJ
12.5000 mg | SUBCUTANEOUS | 0 refills | Status: DC
  Filled 2023-08-14: qty 2, 28d supply, fill #0

## 2023-08-15 ENCOUNTER — Telehealth: Payer: Self-pay | Admitting: Internal Medicine

## 2023-08-15 ENCOUNTER — Other Ambulatory Visit (HOSPITAL_COMMUNITY): Payer: Self-pay

## 2023-08-15 ENCOUNTER — Other Ambulatory Visit: Payer: Self-pay

## 2023-08-15 MED ORDER — TIRZEPATIDE 15 MG/0.5ML ~~LOC~~ SOAJ
15.0000 mg | SUBCUTANEOUS | 0 refills | Status: DC
  Filled 2023-08-15 – 2023-08-17 (×2): qty 2, 28d supply, fill #0

## 2023-08-15 NOTE — Telephone Encounter (Signed)
 Copied from CRM [id number]. Topic: Clinical - Medication Question >> Aug 15, 2023  3:34 PM Chiquita SQUIBB wrote: Reason for CRM: Patient is calling in asking if she could have the higher dose of the MOUNJARO , she stated she has been on 12.5 for a while. Please advise the patient.

## 2023-08-15 NOTE — Telephone Encounter (Signed)
 Spoke with patient today and next dose sent in.  She has been on 12.5 mg for awhile and has done fine.  Did 28 day supply for new dose to make sure she didn't have any side effects.  Patient agreeable with plan.

## 2023-08-16 ENCOUNTER — Other Ambulatory Visit (HOSPITAL_COMMUNITY): Payer: Self-pay

## 2023-08-17 ENCOUNTER — Other Ambulatory Visit (HOSPITAL_COMMUNITY): Payer: Self-pay

## 2023-09-02 ENCOUNTER — Other Ambulatory Visit: Payer: Self-pay | Admitting: Internal Medicine

## 2023-09-05 ENCOUNTER — Encounter: Payer: 59 | Admitting: Internal Medicine

## 2023-09-13 ENCOUNTER — Other Ambulatory Visit: Payer: Self-pay | Admitting: Internal Medicine

## 2023-09-13 ENCOUNTER — Other Ambulatory Visit (HOSPITAL_COMMUNITY): Payer: Self-pay

## 2023-09-14 ENCOUNTER — Other Ambulatory Visit: Payer: Self-pay

## 2023-09-14 ENCOUNTER — Other Ambulatory Visit (HOSPITAL_COMMUNITY): Payer: Self-pay

## 2023-09-14 MED ORDER — MOUNJARO 15 MG/0.5ML ~~LOC~~ SOAJ
15.0000 mg | SUBCUTANEOUS | 0 refills | Status: DC
  Filled 2023-09-14: qty 2, 28d supply, fill #0

## 2023-09-14 MED ORDER — ROSUVASTATIN CALCIUM 5 MG PO TABS
5.0000 mg | ORAL_TABLET | ORAL | 3 refills | Status: DC
  Filled 2023-09-21: qty 32, 75d supply, fill #0
  Filled 2023-09-21: qty 13, 31d supply, fill #0
  Filled 2023-09-21: qty 10, 23d supply, fill #0
  Filled 2023-12-25: qty 10, 23d supply, fill #1

## 2023-09-21 ENCOUNTER — Other Ambulatory Visit: Payer: Self-pay | Admitting: Internal Medicine

## 2023-09-21 ENCOUNTER — Other Ambulatory Visit (HOSPITAL_COMMUNITY): Payer: Self-pay

## 2023-09-22 ENCOUNTER — Other Ambulatory Visit (HOSPITAL_COMMUNITY): Payer: Self-pay

## 2023-09-22 MED ORDER — AMLODIPINE-OLMESARTAN 10-40 MG PO TABS
1.0000 | ORAL_TABLET | Freq: Every day | ORAL | 0 refills | Status: DC
  Filled 2023-09-22: qty 90, 90d supply, fill #0

## 2023-09-22 MED ORDER — HYDROCHLOROTHIAZIDE 25 MG PO TABS
25.0000 mg | ORAL_TABLET | Freq: Every day | ORAL | 0 refills | Status: DC
  Filled 2023-09-22: qty 90, 90d supply, fill #0

## 2023-09-22 MED ORDER — METOPROLOL SUCCINATE ER 100 MG PO TB24
100.0000 mg | ORAL_TABLET | Freq: Every day | ORAL | 0 refills | Status: DC
  Filled 2023-09-22: qty 90, 90d supply, fill #0

## 2023-10-17 ENCOUNTER — Telehealth (HOSPITAL_COMMUNITY): Payer: Self-pay

## 2023-10-17 ENCOUNTER — Other Ambulatory Visit: Payer: Self-pay | Admitting: Internal Medicine

## 2023-10-17 ENCOUNTER — Other Ambulatory Visit (HOSPITAL_COMMUNITY): Payer: Self-pay

## 2023-10-17 MED ORDER — MOUNJARO 15 MG/0.5ML ~~LOC~~ SOAJ
15.0000 mg | SUBCUTANEOUS | 0 refills | Status: DC
  Filled 2023-10-17 (×2): qty 2, 28d supply, fill #0

## 2023-10-17 NOTE — Telephone Encounter (Signed)
 PA request has been Received. New Encounter has been or will be created for follow up. For additional info see Pharmacy Prior Auth telephone encounter from 10/17/23.

## 2023-10-17 NOTE — Telephone Encounter (Signed)
 Pharmacy Patient Advocate Encounter  Received notification from [facility] [employer] that Prior Authorization for Mounjaro  15MG /0.5ML auto-injectors  has been APPROVED from 10/17/23 to 10/16/24. Ran test claim, Copay is $47. This test claim was processed through [facility]- copay amounts may vary at other pharmacies due to pharmacy/plan contracts, or as the patient moves through the different stages of their insurance plan.   PA #/Case ID/Reference #: [id number]

## 2023-10-19 ENCOUNTER — Ambulatory Visit: Payer: Self-pay

## 2023-10-19 ENCOUNTER — Encounter: Payer: Self-pay | Admitting: Internal Medicine

## 2023-10-19 NOTE — Patient Instructions (Incomplete)
     An ultrasound of your leg was ordered.      Medications changes include :   None    A referral was ordered and someone will call you to schedule an appointment.     No follow-ups on file.

## 2023-10-19 NOTE — Telephone Encounter (Signed)
 FYI Only or Action Required?: FYI only for provider.  Patient was last seen in primary care on 03/03/2023 by Geofm Glade PARAS, MD.  Called Nurse Triage reporting right leg pain.  Symptoms began several days ago.  Interventions attempted: Nothing.  Symptoms are: unchanged.  Triage Disposition: See PCP When Office is Open (Within 3 Days)  Patient/caregiver understands and will follow disposition?: Yes     Copied from CRM #[id number]. Topic: Clinical - Red Word Triage >> Oct 19, 2023  9:45 AM Viola FALCON wrote: Red Word that prompted transfer to Nurse Triage: Patient having pain and swelling in right leg Reason for Disposition  [1] MODERATE pain (e.g., interferes with normal activities, limping) AND [2] present > 3 days  Answer Assessment - Initial Assessment Questions 1. ONSET: When did the pain start?      Saturday 2. LOCATION: Where is the pain located?      Right leg pain and swelling 3. PAIN: How bad is the pain?    (Scale 1-10; or mild, moderate, severe)     Mild to moderate 4. WORK OR EXERCISE: Has there been any recent work or exercise that involved this part of the body?      Was using vibration board on Friday 5. CAUSE: What do you think is causing the leg pain?     cellulitis 6. OTHER SYMPTOMS: Do you have any other symptoms? (e.g., chest pain, back pain, breathing difficulty, swelling, rash, fever, numbness, weakness)     no 7. PREGNANCY: Is there any chance you are pregnant? When was your last menstrual period?     na  Protocols used: Leg Pain-A-AH

## 2023-10-19 NOTE — Progress Notes (Unsigned)
    Subjective:    Patient ID: Rebecca Foley, female    DOB: [birth year], 63 y.o.   MRN: [id number]      HPI Rebecca Foley is here for No chief complaint on file.        Medications and allergies reviewed with patient and updated if appropriate.  Current Outpatient Medications on File Prior to Visit  Medication Sig Dispense Refill   amLODipine -olmesartan  (AZOR ) 10-40 MG tablet Take 1 tablet by mouth daily. 90 tablet 0   diclofenac  sodium (VOLTAREN ) 1 % GEL Apply 4 g topically 4 (four) times daily. 4 Tube 0   fluticasone  (FLONASE ) 50 MCG/ACT nasal spray SPRAY 2 SPRAYS INTO EACH NOSTRIL EVERY DAY 48 mL 2   hydrochlorothiazide  (HYDRODIURIL ) 25 MG tablet Take 1 tablet (25 mg total) by mouth daily. 90 tablet 0   ibuprofen (ADVIL,MOTRIN) 200 MG tablet Take 800 mg by mouth every 6 (six) hours as needed (pain).     levocetirizine (XYZAL ) 5 MG tablet Take 1 tablet (5 mg total) by mouth every evening. 90 tablet 1   meloxicam  (MOBIC ) 15 MG tablet Take 1 tablet (15 mg total) by mouth daily as needed for pain. 30 tablet 0   metoprolol  succinate (TOPROL -XL) 100 MG 24 hr tablet Take 1 tablet (100 mg total) by mouth daily. TAKE WITH OR IMMEDIATELY FOLLOWING A MEAL. 90 tablet 0   rosuvastatin  (CRESTOR ) 5 MG tablet Take 1 tablet (5 mg total) by mouth 3 (three) times a week. 13 tablet 3   tirzepatide  (MOUNJARO ) 12.5 MG/0.5ML Pen Inject 12.5 mg into the skin once a week. 6 mL 0   tirzepatide  (MOUNJARO ) 15 MG/0.5ML Pen Inject 15 mg into the skin once a week. 2 mL 0   triamcinolone  cream (KENALOG ) 0.1 % Apply 1 Application topically 2 (two) times daily. 30 g 0   vitamin B-12 (CYANOCOBALAMIN ) 1000 MCG tablet Take 1 tablet (1,000 mcg total) by mouth daily.     No current facility-administered medications on file prior to visit.    Review of Systems     Objective:  There were no vitals filed for this visit. BP Readings from Last 3 Encounters:  03/08/23 130/70  03/03/23 130/74  08/31/22 130/74   Wt  Readings from Last 3 Encounters:  03/08/23 238 lb (108 kg)  03/03/23 236 lb (107 kg)  08/31/22 233 lb (105.7 kg)   There is no height or weight on file to calculate BMI.    Physical Exam         Assessment & Plan:    See Problem List for Assessment and Plan of chronic medical problems.

## 2023-10-20 ENCOUNTER — Ambulatory Visit: Payer: Self-pay | Admitting: Internal Medicine

## 2023-10-20 ENCOUNTER — Other Ambulatory Visit (HOSPITAL_COMMUNITY): Payer: Self-pay

## 2023-10-20 ENCOUNTER — Ambulatory Visit (INDEPENDENT_AMBULATORY_CARE_PROVIDER_SITE_OTHER): Admitting: Internal Medicine

## 2023-10-20 ENCOUNTER — Ambulatory Visit (HOSPITAL_COMMUNITY)
Admission: RE | Admit: 2023-10-20 | Discharge: 2023-10-20 | Disposition: A | Discharge status: Home / Self Care | Source: Ambulatory Visit | Attending: Internal Medicine | Admitting: Internal Medicine

## 2023-10-20 VITALS — BP 134/70 | HR 74 | Temp 98.6°F | Ht 65.0 in | Wt 240.0 lb

## 2023-10-20 DIAGNOSIS — M79661 Pain in right lower leg: Secondary | ICD-10-CM | POA: Diagnosis not present

## 2023-10-20 DIAGNOSIS — R2241 Localized swelling, mass and lump, right lower limb: Secondary | ICD-10-CM | POA: Diagnosis not present

## 2023-10-20 DIAGNOSIS — I152 Hypertension secondary to endocrine disorders: Secondary | ICD-10-CM | POA: Diagnosis not present

## 2023-10-20 DIAGNOSIS — E1159 Type 2 diabetes mellitus with other circulatory complications: Secondary | ICD-10-CM

## 2023-10-20 DIAGNOSIS — L03115 Cellulitis of right lower limb: Secondary | ICD-10-CM | POA: Diagnosis not present

## 2023-10-20 MED ORDER — AMOXICILLIN-POT CLAVULANATE 875-125 MG PO TABS
1.0000 | ORAL_TABLET | Freq: Two times a day (BID) | ORAL | 0 refills | Status: AC
  Filled 2023-10-20: qty 20, 10d supply, fill #0

## 2023-10-20 NOTE — Assessment & Plan Note (Signed)
 Chronic Blood pressure controlled Continue amlodipine -olmesartan  10-40 mg daily, HCTZ 25 mg daily, metoprolol  XL 200 mg daily

## 2023-10-20 NOTE — Assessment & Plan Note (Signed)
 Acute History of cellulitis in RLE 2016 Symptoms including pain, swelling, redness, warmth and skin induration consistent with cellulitis Start Augmentin  875-125 mg twice daily x 10 days Will check an ultrasound to rule out DVT but I think that is less likely Elevate legs when sitting, but okay to exercise and wear compression socks Call if symptoms do not improve.

## 2023-10-20 NOTE — Assessment & Plan Note (Signed)
 Acute Localized pain to the one third of her right lower leg Also has some swelling, redness, warmth and skin induration Likely cellulitis-has a history of cellulitis in this leg Less likely DVT, but because it is a possibility to rule it out-ultrasound ordered Augmentin  for cellulitis Over-the-counter pain medications as needed for pain Call return if symptoms are not improving

## 2023-10-20 NOTE — Assessment & Plan Note (Signed)
 Acute Localized swelling to the one third of her right lower leg Also has some pain, redness, warmth and skin induration Likely cellulitis-has a history of cellulitis in this leg Less likely DVT, but because it is a possibility to rule it out-ultrasound ordered Augmentin  for cellulitis Over-the-counter pain medications as needed for pain Call return if symptoms are not improving

## 2023-11-03 ENCOUNTER — Encounter: Admitting: Internal Medicine

## 2023-12-01 ENCOUNTER — Other Ambulatory Visit (HOSPITAL_COMMUNITY): Payer: Self-pay

## 2023-12-01 ENCOUNTER — Other Ambulatory Visit: Payer: Self-pay | Admitting: Internal Medicine

## 2023-12-01 MED ORDER — MOUNJARO 15 MG/0.5ML ~~LOC~~ SOAJ
15.0000 mg | SUBCUTANEOUS | 0 refills | Status: DC
  Filled 2023-12-01: qty 2, 28d supply, fill #0

## 2023-12-05 ENCOUNTER — Other Ambulatory Visit (HOSPITAL_COMMUNITY): Payer: Self-pay

## 2023-12-08 ENCOUNTER — Other Ambulatory Visit (HOSPITAL_COMMUNITY): Payer: Self-pay

## 2023-12-08 ENCOUNTER — Other Ambulatory Visit: Payer: Self-pay

## 2023-12-08 ENCOUNTER — Other Ambulatory Visit: Payer: Self-pay | Admitting: Internal Medicine

## 2023-12-08 MED ORDER — HYDROCHLOROTHIAZIDE 25 MG PO TABS
25.0000 mg | ORAL_TABLET | Freq: Every day | ORAL | 0 refills | Status: DC
  Filled 2023-12-08: qty 90, 90d supply, fill #0

## 2023-12-08 MED ORDER — AMLODIPINE-OLMESARTAN 10-40 MG PO TABS
1.0000 | ORAL_TABLET | Freq: Every day | ORAL | 0 refills | Status: DC
  Filled 2023-12-08: qty 90, 90d supply, fill #0

## 2023-12-15 ENCOUNTER — Encounter: Payer: Self-pay | Admitting: Pharmacist

## 2023-12-15 NOTE — Progress Notes (Signed)
 Pharmacy Quality Measure Review  This patient is appearing on a report for being at risk of failing the adherence measure for diabetes medications this calendar year.   Medication: Mounjaro  Last fill date: 12/05/23 for 28 day supply  Reviewed fill history and it appears patient has usually been late for refill by 2-4 weeks through out the year. The absolute fail date is 12/12/23 and she is not due for her next refill until December. No further action needed, will fail MAD metric for this year.  Darrelyn Drum, PharmD, BCPS, CPP Clinical Pharmacist Practitioner [place] [facility] [phone]

## 2023-12-17 ENCOUNTER — Encounter: Payer: Self-pay | Admitting: Internal Medicine

## 2023-12-17 NOTE — Progress Notes (Unsigned)
 Subjective:    Patient ID: Rebecca Foley, female    DOB: Jan 06, 1961, 63 y.o.   MRN: [id number]      HPI Rebecca Foley is here for a Physical exam and her chronic medical problems.    Has gained some weight.   Other than that feels like she is doing well.  She is exercising regularly.   Some days mounjaro  feels like it is working.     Medications and allergies reviewed with patient and updated if appropriate.  Current Outpatient Medications on File Prior to Visit  Medication Sig Dispense Refill   amLODipine -olmesartan  (AZOR ) 10-40 MG tablet Take 1 tablet by mouth daily. 90 tablet 0   diclofenac  sodium (VOLTAREN ) 1 % GEL Apply 4 g topically 4 (four) times daily. 4 Tube 0   fluticasone  (FLONASE ) 50 MCG/ACT nasal spray SPRAY 2 SPRAYS INTO EACH NOSTRIL EVERY DAY 48 mL 2   hydrochlorothiazide  (HYDRODIURIL ) 25 MG tablet Take 1 tablet (25 mg total) by mouth daily. 90 tablet 0   ibuprofen (ADVIL,MOTRIN) 200 MG tablet Take 800 mg by mouth every 6 (six) hours as needed (pain).     levocetirizine (XYZAL ) 5 MG tablet Take 1 tablet (5 mg total) by mouth every evening. 90 tablet 1   meloxicam  (MOBIC ) 15 MG tablet Take 1 tablet (15 mg total) by mouth daily as needed for pain. 30 tablet 0   metoprolol  succinate (TOPROL -XL) 100 MG 24 hr tablet Take 1 tablet (100 mg total) by mouth daily. TAKE WITH OR IMMEDIATELY FOLLOWING A MEAL. 90 tablet 0   rosuvastatin  (CRESTOR ) 5 MG tablet Take 1 tablet (5 mg total) by mouth 3 (three) times a week. 13 tablet 3   tirzepatide  (MOUNJARO ) 15 MG/0.5ML Pen Inject 15 mg into the skin once a week. 2 mL 0   triamcinolone  cream (KENALOG ) 0.1 % Apply 1 Application topically 2 (two) times daily. 30 g 0   vitamin B-12 (CYANOCOBALAMIN ) 1000 MCG tablet Take 1 tablet (1,000 mcg total) by mouth daily.     No current facility-administered medications on file prior to visit.    Review of Systems  Constitutional:  Negative for fever.  Eyes:  Negative for visual disturbance.   Respiratory:  Negative for cough, shortness of breath and wheezing.   Cardiovascular:  Negative for chest pain, palpitations and leg swelling.  Gastrointestinal:  Negative for abdominal pain, blood in stool, constipation and diarrhea.       No gerd  Genitourinary:  Negative for dysuria.  Musculoskeletal:  Negative for arthralgias (knee pain - intermitent) and back pain.  Skin:  Positive for rash (? cholrine from pool).  Neurological:  Positive for headaches (occ). Negative for light-headedness.  Psychiatric/Behavioral:  Negative for dysphoric mood. The patient is not nervous/anxious.        Objective:   Vitals:   12/18/23 1338  BP: 130/72  Pulse: 62  Temp: 97.9 F (36.6 C)  SpO2: 100%   Filed Weights   12/18/23 1338  Weight: 245 lb (111.1 kg)   Body mass index is 40.77 kg/m.  BP Readings from Last 3 Encounters:  12/18/23 130/72  10/20/23 134/70  03/08/23 130/70    Wt Readings from Last 3 Encounters:  12/18/23 245 lb (111.1 kg)  10/20/23 240 lb (108.9 kg)  03/08/23 238 lb (108 kg)       Physical Exam Constitutional: She appears well-developed and well-nourished. No distress.  HENT:  Head: Normocephalic and atraumatic.  Right Ear: External ear normal. Normal ear canal  and TM Left Ear: External ear normal.  Normal ear canal and TM Mouth/Throat: Oropharynx is clear and moist.  Eyes: Conjunctivae normal.  Neck: Neck supple. No tracheal deviation present. No thyromegaly present.  No carotid bruit  Cardiovascular: Normal rate, regular rhythm and normal heart sounds.   No murmur heard.  No edema. Pulmonary/Chest: Effort normal and breath sounds normal. No respiratory distress. She has no wheezes. She has no rales.  Breast: deferred   Abdominal: Soft. She exhibits no distension. There is no tenderness.  Lymphadenopathy: She has no cervical adenopathy.  Skin: Skin is warm and dry. She is not diaphoretic.  Psychiatric: She has a normal mood and affect. Her behavior  is normal.     Lab Results  Component Value Date   WBC 4.8 08/31/2022   HGB 13.3 08/31/2022   HCT 39.4 08/31/2022   PLT 254.0 08/31/2022   GLUCOSE 85 03/03/2023   CHOL 171 03/03/2023   TRIG 103.0 03/03/2023   HDL 57.20 03/03/2023   LDLCALC 93 03/03/2023   ALT 16 03/03/2023   AST 18 03/03/2023   NA 140 03/03/2023   K 3.9 03/03/2023   CL 104 03/03/2023   CREATININE 0.90 03/03/2023   BUN 17 03/03/2023   CO2 29 03/03/2023   TSH 2.38 08/31/2022   HGBA1C 5.6 03/03/2023   MICROALBUR 0.7 02/06/2006    EKG - NSR @ 85 bpm, RSR in V1-2, normal EKG.  Sinus tachycardia no longer present compared to EKG from 2015      Assessment & Plan:   Physical exam: Screening blood work  ordered Exercise  trainer 2/week, water  aerobics 2/week Weight  obese Substance abuse  none   Reviewed recommended immunizations.   Health Maintenance  Topic Date Due   Medicare Annual Wellness (AWV)  Never done   FOOT EXAM  Never done   OPHTHALMOLOGY EXAM  Never done   Pneumococcal Vaccine: 50+ Years (1 of 2 - PCV) Never done   Cervical Cancer Screening (HPV/Pap Cotest)  Never done   Diabetic kidney evaluation - Urine ACR  02/07/2007   Mammogram  03/12/2016   Colonoscopy  08/28/2019   DEXA SCAN  Never done   HEMOGLOBIN A1C  08/31/2023   COVID-19 Vaccine (2 - 2025-26 season) 10/02/2023   Diabetic kidney evaluation - eGFR measurement  03/02/2024   DTaP/Tdap/Td (2 - Td or Tdap) 07/14/2029   Hepatitis C Screening  Completed   HIV Screening  Completed   Zoster Vaccines- Shingrix   Completed   Hepatitis B Vaccines 19-59 Average Risk  Aged Out   Meningococcal B Vaccine  Aged Out   Influenza Vaccine  Discontinued     Cologuard ordered     See Problem List for Assessment and Plan of chronic medical problems.

## 2023-12-17 NOTE — Patient Instructions (Addendum)
 Call and schedule your mammogram - [facility]  by calling [phone]    Blood work was ordered.   Have this done in a couple of weeks.     Medications changes include :   decrease hydrochlorothiazide  to 12.5 mg daily     Return in about 6 months (around 06/16/2024) for follow up.   Health Maintenance, Female Adopting a healthy lifestyle and getting preventive care are important in promoting health and wellness. Ask your health care provider about: The right schedule for you to have regular tests and exams. Things you can do on your own to prevent diseases and keep yourself healthy. What should I know about diet, weight, and exercise? Eat a healthy diet  Eat a diet that includes plenty of vegetables, fruits, low-fat dairy products, and lean protein. Do not eat a lot of foods that are high in solid fats, added sugars, or sodium. Maintain a healthy weight Body mass index (BMI) is used to identify weight problems. It estimates body fat based on height and weight. Your health care provider can help determine your BMI and help you achieve or maintain a healthy weight. Get regular exercise Get regular exercise. This is one of the most important things you can do for your health. Most adults should: Exercise for at least 150 minutes each week. The exercise should increase your heart rate and make you sweat (moderate-intensity exercise). Do strengthening exercises at least twice a week. This is in addition to the moderate-intensity exercise. Spend less time sitting. Even light physical activity can be beneficial. Watch cholesterol and blood lipids Have your blood tested for lipids and cholesterol at 65 years of age, then have this test every 5 years. Have your cholesterol levels checked more often if: Your lipid or cholesterol levels are high. You are older than 65 years of age. You are at high risk for heart disease. What should I know about cancer screening? Depending  on your health history and family history, you may need to have cancer screening at various ages. This may include screening for: Breast cancer. Cervical cancer. Colorectal cancer. Skin cancer. Lung cancer. What should I know about heart disease, diabetes, and high blood pressure? Blood pressure and heart disease High blood pressure causes heart disease and increases the risk of stroke. This is more likely to develop in people who have high blood pressure readings or are overweight. Have your blood pressure checked: Every 3-5 years if you are 87-83 years of age. Every year if you are 33 years old or older. Diabetes Have regular diabetes screenings. This checks your fasting blood sugar level. Have the screening done: Once every three years after age 45 if you are at a normal weight and have a low risk for diabetes. More often and at a younger age if you are overweight or have a high risk for diabetes. What should I know about preventing infection? Hepatitis B If you have a higher risk for hepatitis B, you should be screened for this virus. Talk with your health care provider to find out if you are at risk for hepatitis B infection. Hepatitis C Testing is recommended for: Everyone born from [birth year] through 1967. Anyone with known risk factors for hepatitis C. Sexually transmitted infections (STIs) Get screened for STIs, including gonorrhea and chlamydia, if: You are sexually active and are younger than 65 years of age. You are older than 65 years of age and your health care provider tells you that you  are at risk for this type of infection. Your sexual activity has changed since you were last screened, and you are at increased risk for chlamydia or gonorrhea. Ask your health care provider if you are at risk. Ask your health care provider about whether you are at high risk for HIV. Your health care provider may recommend a prescription medicine to help prevent HIV infection. If you choose to  take medicine to prevent HIV, you should first get tested for HIV. You should then be tested every 3 months for as long as you are taking the medicine. Pregnancy If you are about to stop having your period (premenopausal) and you may become pregnant, seek counseling before you get pregnant. Take 400 to 800 micrograms (mcg) of folic acid  every day if you become pregnant. Ask for birth control (contraception) if you want to prevent pregnancy. Osteoporosis and menopause Osteoporosis is a disease in which the bones lose minerals and strength with aging. This can result in bone fractures. If you are 1 years old or older, or if you are at risk for osteoporosis and fractures, ask your health care provider if you should: Be screened for bone loss. Take a calcium  or vitamin D supplement to lower your risk of fractures. Be given hormone replacement therapy (HRT) to treat symptoms of menopause. Follow these instructions at home: Alcohol use Do not drink alcohol if: Your health care provider tells you not to drink. You are pregnant, may be pregnant, or are planning to become pregnant. If you drink alcohol: Limit how much you have to: 0-1 drink a day. Know how much alcohol is in your drink. In the [place] one drink equals one 12 oz bottle of beer (355 mL), one 5 oz glass of wine (148 mL), or one 1 oz glass of hard liquor (44 mL). Lifestyle Do not use any products that contain nicotine or tobacco. These products include cigarettes, chewing tobacco, and vaping devices, such as e-cigarettes. If you need help quitting, ask your health care provider. Do not use street drugs. Do not share needles. Ask your health care provider for help if you need support or information about quitting drugs. General instructions Schedule regular health, dental, and eye exams. Stay current with your vaccines. Tell your health care provider if: You often feel depressed. You have ever been abused or do not feel safe at  home. Summary Adopting a healthy lifestyle and getting preventive care are important in promoting health and wellness. Follow your health care provider's instructions about healthy diet, exercising, and getting tested or screened for diseases. Follow your health care provider's instructions on monitoring your cholesterol and blood pressure. This information is not intended to replace advice given to you by your health care provider. Make sure you discuss any questions you have with your health care provider. Document Revised: 06/08/2020 Document Reviewed: 06/08/2020 Elsevier Patient Education  2024 [employer].

## 2023-12-18 ENCOUNTER — Other Ambulatory Visit (HOSPITAL_COMMUNITY): Payer: Self-pay

## 2023-12-18 ENCOUNTER — Other Ambulatory Visit: Payer: Self-pay

## 2023-12-18 ENCOUNTER — Ambulatory Visit (INDEPENDENT_AMBULATORY_CARE_PROVIDER_SITE_OTHER): Admitting: Internal Medicine

## 2023-12-18 VITALS — BP 130/72 | HR 62 | Temp 97.9°F | Ht 65.0 in | Wt 245.0 lb

## 2023-12-18 DIAGNOSIS — Z1211 Encounter for screening for malignant neoplasm of colon: Secondary | ICD-10-CM

## 2023-12-18 DIAGNOSIS — E1169 Type 2 diabetes mellitus with other specified complication: Secondary | ICD-10-CM

## 2023-12-18 DIAGNOSIS — E1159 Type 2 diabetes mellitus with other circulatory complications: Secondary | ICD-10-CM

## 2023-12-18 DIAGNOSIS — E785 Hyperlipidemia, unspecified: Secondary | ICD-10-CM | POA: Diagnosis not present

## 2023-12-18 DIAGNOSIS — Z7985 Long-term (current) use of injectable non-insulin antidiabetic drugs: Secondary | ICD-10-CM | POA: Diagnosis not present

## 2023-12-18 DIAGNOSIS — D519 Vitamin B12 deficiency anemia, unspecified: Secondary | ICD-10-CM

## 2023-12-18 DIAGNOSIS — I152 Hypertension secondary to endocrine disorders: Secondary | ICD-10-CM

## 2023-12-18 DIAGNOSIS — Z0000 Encounter for general adult medical examination without abnormal findings: Secondary | ICD-10-CM | POA: Diagnosis not present

## 2023-12-18 MED ORDER — MOUNJARO 15 MG/0.5ML ~~LOC~~ SOAJ
15.0000 mg | SUBCUTANEOUS | 5 refills | Status: AC
  Filled 2023-12-18 – 2023-12-22 (×3): qty 2, 28d supply, fill #0
  Filled 2024-01-15: qty 2, 28d supply, fill #1
  Filled 2024-03-05: qty 2, 28d supply, fill #2

## 2023-12-18 MED ORDER — AMLODIPINE-OLMESARTAN 10-40 MG PO TABS
1.0000 | ORAL_TABLET | Freq: Every day | ORAL | 1 refills | Status: AC
  Filled 2023-12-18: qty 90, 90d supply, fill #0

## 2023-12-18 MED ORDER — HYDROCHLOROTHIAZIDE 12.5 MG PO TABS
12.5000 mg | ORAL_TABLET | Freq: Every day | ORAL | 3 refills | Status: AC
Start: 2023-12-18 — End: ?
  Filled 2023-12-18: qty 90, 90d supply, fill #0

## 2023-12-18 NOTE — Assessment & Plan Note (Signed)
 Chronic Associated with hyperlipidemia and hypertension Lab Results  Component Value Date   HGBA1C 5.6 03/03/2023   Sugars well controlled Check A1c, lipid panel, urine albumin/creatinine ratio Continue Mounjaro  - increase to 12.5 mg weekly Stressed regular exercise, diabetic diet Advised to work harder on weight loss, decrease calorie intake

## 2023-12-18 NOTE — Assessment & Plan Note (Signed)
 Chronic Cholesterol slightly higher than ideal and has diabetes Has diabetes, hypertension Check lipid panel Continue Crestor  5 mg 3 times a week  EKG today

## 2023-12-18 NOTE — Assessment & Plan Note (Signed)
 Chronic Blood pressure controlled Continue amlodipine -olmesartan  10-40 mg daily, HCTZ 25 mg daily, metoprolol  XL 200 mg daily CBC, CMP  EKG today

## 2023-12-18 NOTE — Assessment & Plan Note (Signed)
 Chronic Continue B12 supplementation Check vitamin B12 level

## 2023-12-18 NOTE — Assessment & Plan Note (Signed)
 Chronic BMI 40.77 Has gained weight since she was here last-has not been eating as well Has been on Mounjaro  and was losing weight up until the last few months Continue Mounjaro  for diabetes and weight Continue regular exercise Stressed that she needs to work on her eating-needs to decrease calorie intake and eat healthier

## 2023-12-18 NOTE — Assessment & Plan Note (Signed)
 Calcium  level has been elevated and blood work Likely related to hydrochlorothiazide  I think she does need hydrochlorothiazide  for her blood pressure Stressed weight loss-work on eating and continue Mounjaro  Decrease hydrochlorothiazide  to 12.5 mg daily CMP blood work in 2 weeks

## 2023-12-19 ENCOUNTER — Other Ambulatory Visit (HOSPITAL_COMMUNITY): Payer: Self-pay

## 2023-12-19 ENCOUNTER — Ambulatory Visit: Payer: Self-pay | Admitting: Internal Medicine

## 2023-12-19 ENCOUNTER — Other Ambulatory Visit: Payer: Self-pay

## 2023-12-19 ENCOUNTER — Other Ambulatory Visit: Payer: Self-pay | Admitting: Internal Medicine

## 2023-12-19 LAB — MICROALBUMIN / CREATININE URINE RATIO
Creatinine,U: 57.2 mg/dL
Microalb Creat Ratio: UNDETERMINED mg/g (ref 0.0–30.0)
Microalb, Ur: <0.7 mg/dL

## 2023-12-19 MED ORDER — METOPROLOL SUCCINATE ER 100 MG PO TB24
100.0000 mg | ORAL_TABLET | Freq: Every day | ORAL | 0 refills | Status: AC
  Filled 2023-12-19: qty 90, 90d supply, fill #0

## 2023-12-22 ENCOUNTER — Other Ambulatory Visit: Payer: Self-pay

## 2023-12-22 ENCOUNTER — Other Ambulatory Visit (HOSPITAL_COMMUNITY): Payer: Self-pay

## 2023-12-25 ENCOUNTER — Other Ambulatory Visit (HOSPITAL_COMMUNITY): Payer: Self-pay

## 2024-01-10 ENCOUNTER — Encounter: Payer: Self-pay | Admitting: Pharmacist

## 2024-01-10 NOTE — Progress Notes (Signed)
 Pharmacy Quality Measure Review  This patient is appearing on a report for being at risk of failing the adherence measure for hypertension (ACEi/ARB) medications this calendar year.   Medication: Amlodipine -olmesartan  10-40 mg Last fill date: 12/11/23 for 90 day supply  Insurance report was not up to date. No action needed at this time.   Darrelyn Drum, PharmD, BCPS, CPP Clinical Pharmacist Practitioner [place] [facility] [phone]

## 2024-01-15 ENCOUNTER — Other Ambulatory Visit (HOSPITAL_COMMUNITY): Payer: Self-pay

## 2024-01-16 NOTE — Progress Notes (Signed)
 Pharmacy Quality Measure Review  This patient is appearing on a report for being at risk of failing the adherence measure for cholesterol (statin) medications this calendar year.   Medication: Rosuvastatin  Last fill date: 01/02/24 for 23 day supply  Insurance report was not up to date. No action needed at this time.   Darrelyn Drum, PharmD, BCPS, CPP Clinical Pharmacist Practitioner [facility] [phone]

## 2024-01-22 ENCOUNTER — Other Ambulatory Visit: Payer: Self-pay | Admitting: Internal Medicine

## 2024-01-22 ENCOUNTER — Other Ambulatory Visit (HOSPITAL_COMMUNITY): Payer: Self-pay

## 2024-01-22 MED ORDER — ROSUVASTATIN CALCIUM 5 MG PO TABS
5.0000 mg | ORAL_TABLET | ORAL | 3 refills | Status: AC
  Filled 2024-01-22: qty 13, 30d supply, fill #0
  Filled 2024-02-19: qty 13, 30d supply, fill #1

## 2024-01-23 ENCOUNTER — Ambulatory Visit: Admitting: Family Medicine

## 2024-01-23 ENCOUNTER — Encounter: Payer: Self-pay | Admitting: Family Medicine

## 2024-01-23 ENCOUNTER — Other Ambulatory Visit (HOSPITAL_COMMUNITY): Payer: Self-pay

## 2024-01-23 VITALS — BP 156/90 | HR 94 | Temp 98.8°F | Ht 65.0 in | Wt 245.0 lb

## 2024-01-23 DIAGNOSIS — J3089 Other allergic rhinitis: Secondary | ICD-10-CM

## 2024-01-23 DIAGNOSIS — I152 Hypertension secondary to endocrine disorders: Secondary | ICD-10-CM

## 2024-01-23 DIAGNOSIS — J302 Other seasonal allergic rhinitis: Secondary | ICD-10-CM

## 2024-01-23 DIAGNOSIS — R6889 Other general symptoms and signs: Secondary | ICD-10-CM | POA: Diagnosis not present

## 2024-01-23 DIAGNOSIS — E1159 Type 2 diabetes mellitus with other circulatory complications: Secondary | ICD-10-CM

## 2024-01-23 LAB — POC INFLUENZA A&B (BINAX/QUICKVUE)
Influenza A, POC: NEGATIVE
Influenza B, POC: NEGATIVE

## 2024-01-23 LAB — POC COVID19 BINAXNOW: SARS Coronavirus 2 Ag: NEGATIVE

## 2024-01-23 MED ORDER — FLUTICASONE PROPIONATE 50 MCG/ACT NA SUSP
2.0000 | Freq: Every day | NASAL | 0 refills | Status: AC
  Filled 2024-01-23: qty 48, 84d supply, fill #0

## 2024-01-23 MED ORDER — LEVOCETIRIZINE DIHYDROCHLORIDE 5 MG PO TABS
5.0000 mg | ORAL_TABLET | Freq: Every evening | ORAL | 0 refills | Status: AC
  Filled 2024-01-23: qty 90, 90d supply, fill #0

## 2024-01-23 NOTE — Progress Notes (Signed)
 Subjective:    Discussed the use of AI scribe software for clinical note transcription with the patient, who gave verbal consent to proceed.  History of Present Illness Rebecca Foley is a 63 year old female who presents with recent flu exposure and symptoms of scratchy ears and nasal congestion.  Upper respiratory symptoms - Scratchy throat and itchy ears onset 2 days ago - Mild nasal congestion, improves with mask use - No fever, chills, body aches, headache, cough, chest congestion, runny nose, or increased sneezing  Recent influenza exposure - Direct exposure to daughter diagnosed with influenza on Sunday  Allergic symptoms - Symptoms feel more like allergies than influenza - No history of winter allergies, but current episode resembles allergic symptoms  Medication use - Took Dayquil this morning and Nyquil last night - Has Xyzal  or Zyrtec at home for allergies - Not using Flonase   Pulmonary history - No history of asthma or COPD - Does not smoke     ROS as in subjective.   Objective: Vitals:   01/23/24 1420 01/23/24 1434  BP: (!) 158/90 (!) 156/90  Pulse: 94   Temp: 98.8 F (37.1 C)   SpO2: 94%     General appearance: Alert, WD/WN, no distress                             Skin: warm, no rash                           Head: no sinus tenderness                            Eyes: conjunctiva normal, corneas clear, PERRLA                            Ears: pearly TMs, external ear canals normal                          Nose: septum midline, turbinates swollen, with erythema and clear discharge             Mouth/throat: MMM, tongue normal, mild pharyngeal erythema                           Neck: supple, no adenopathy, no thyromegaly, nontender                          Heart: RRR                         Lungs: CTA bilaterally, no wheezes, rales, or rhonchi      Assessment/Plan:  Assessment and Plan Assessment & Plan Allergic rhinitis Symptoms include itchy  ears and scratchy throat, consistent with allergic rhinitis. No signs of infection in the ears.  - Resume Xyzal  or Zyrtec for allergy symptoms.  Prescription for Xyzal  sent to pharmacy - Prescription sent for Flonase  for nasal congestion - Use plain Mucinex for mucus management  Flu-like symptoms Recent exposure to flu with symptoms starting two days ago. Symptoms include scratchy throat and nasal congestion, but no fever, chills, body aches, or cough. COVID and flu tests are pending. Symptoms do not strongly suggest flu or COVID, but mild cases are possible. -  negative COVID and flu test results - Stay hydrated and treat symptoms as needed  Type 2 diabetes mellitus with hypertension Blood pressure monitoring at home is ongoing. Stress may contribute to elevated blood pressure. - Rechecked blood pressure in the office - Continue home blood pressure monitoring

## 2024-01-23 NOTE — Patient Instructions (Signed)
 Your COVID and flu test are both negative today.  I recommend restarting your allergy medication  Stay hydrated  Your blood pressure is elevated today so be sure you are monitoring it at home and follow-up with Dr. Geofm if it is consistently higher than 130/80.

## 2024-02-20 ENCOUNTER — Other Ambulatory Visit (HOSPITAL_COMMUNITY): Payer: Self-pay

## 2024-03-07 ENCOUNTER — Other Ambulatory Visit (HOSPITAL_BASED_OUTPATIENT_CLINIC_OR_DEPARTMENT_OTHER): Payer: Self-pay

## 2024-03-07 ENCOUNTER — Other Ambulatory Visit (HOSPITAL_COMMUNITY): Payer: Self-pay

## 2024-06-17 ENCOUNTER — Ambulatory Visit: Admitting: Internal Medicine
